# Patient Record
Sex: Female | Born: 1975 | Race: Asian | Hispanic: No | Marital: Single | Smoking: Current every day smoker
Health system: Southern US, Community
[De-identification: ages and names within clinical notes are randomized; demographics above are authoritative.]

## PROBLEM LIST (undated history)

## (undated) DIAGNOSIS — I10 Essential (primary) hypertension: Secondary | ICD-10-CM

## (undated) DIAGNOSIS — T7840XA Allergy, unspecified, initial encounter: Secondary | ICD-10-CM

## (undated) DIAGNOSIS — E785 Hyperlipidemia, unspecified: Secondary | ICD-10-CM

## (undated) DIAGNOSIS — F419 Anxiety disorder, unspecified: Secondary | ICD-10-CM

## (undated) DIAGNOSIS — G4730 Sleep apnea, unspecified: Secondary | ICD-10-CM

## (undated) DIAGNOSIS — Q248 Other specified congenital malformations of heart: Secondary | ICD-10-CM

## (undated) DIAGNOSIS — R011 Cardiac murmur, unspecified: Secondary | ICD-10-CM

## (undated) HISTORY — PX: TOTAL HIP ARTHROPLASTY: SHX124

## (undated) HISTORY — DX: Anxiety disorder, unspecified: F41.9

## (undated) HISTORY — DX: Hyperlipidemia, unspecified: E78.5

## (undated) HISTORY — DX: Other specified congenital malformations of heart: Q24.8

## (undated) HISTORY — DX: Sleep apnea, unspecified: G47.30

## (undated) HISTORY — DX: Allergy, unspecified, initial encounter: T78.40XA

## (undated) HISTORY — DX: Cardiac murmur, unspecified: R01.1

## (undated) HISTORY — PX: WISDOM TOOTH EXTRACTION: SHX21

---

## 2004-08-03 ENCOUNTER — Emergency Department (HOSPITAL_COMMUNITY): Admission: EM | Admit: 2004-08-03 | Discharge: 2004-08-03 | Payer: Self-pay | Admitting: Emergency Medicine

## 2004-09-08 ENCOUNTER — Ambulatory Visit: Payer: Self-pay | Admitting: Family Medicine

## 2005-05-26 ENCOUNTER — Ambulatory Visit: Payer: Self-pay | Admitting: Family Medicine

## 2005-07-12 ENCOUNTER — Other Ambulatory Visit: Admission: RE | Admit: 2005-07-12 | Discharge: 2005-07-12 | Payer: Self-pay | Admitting: Family Medicine

## 2005-07-12 ENCOUNTER — Ambulatory Visit: Payer: Self-pay | Admitting: Family Medicine

## 2005-07-12 ENCOUNTER — Encounter: Payer: Self-pay | Admitting: Family Medicine

## 2005-11-26 ENCOUNTER — Ambulatory Visit: Payer: Self-pay | Admitting: Family Medicine

## 2006-03-31 ENCOUNTER — Ambulatory Visit: Payer: Self-pay | Admitting: Family Medicine

## 2006-05-12 ENCOUNTER — Ambulatory Visit: Payer: Self-pay | Admitting: Family Medicine

## 2006-08-18 ENCOUNTER — Ambulatory Visit: Payer: Self-pay | Admitting: Family Medicine

## 2006-08-24 ENCOUNTER — Ambulatory Visit: Payer: Self-pay | Admitting: Licensed Clinical Social Worker

## 2006-08-25 ENCOUNTER — Ambulatory Visit: Payer: Self-pay | Admitting: Family Medicine

## 2008-11-19 ENCOUNTER — Other Ambulatory Visit: Admission: RE | Admit: 2008-11-19 | Discharge: 2008-11-19 | Payer: Self-pay | Admitting: Family Medicine

## 2008-11-19 ENCOUNTER — Encounter: Payer: Self-pay | Admitting: Family Medicine

## 2008-11-19 ENCOUNTER — Ambulatory Visit: Payer: Self-pay | Admitting: Family Medicine

## 2008-11-19 LAB — CONVERTED CEMR LAB: Beta hcg, urine, semiquantitative: NEGATIVE

## 2008-12-19 ENCOUNTER — Telehealth: Payer: Self-pay | Admitting: Family Medicine

## 2009-02-26 ENCOUNTER — Ambulatory Visit: Payer: Self-pay | Admitting: Family Medicine

## 2009-04-24 ENCOUNTER — Encounter: Payer: Self-pay | Admitting: Family Medicine

## 2009-05-13 ENCOUNTER — Other Ambulatory Visit: Admission: RE | Admit: 2009-05-13 | Discharge: 2009-05-13 | Payer: Self-pay | Admitting: Family Medicine

## 2009-05-13 ENCOUNTER — Encounter: Payer: Self-pay | Admitting: Family Medicine

## 2009-05-13 ENCOUNTER — Ambulatory Visit: Payer: Self-pay | Admitting: Family Medicine

## 2009-05-16 ENCOUNTER — Telehealth: Payer: Self-pay | Admitting: Family Medicine

## 2009-10-03 ENCOUNTER — Other Ambulatory Visit: Admission: RE | Admit: 2009-10-03 | Discharge: 2009-10-03 | Payer: Self-pay | Admitting: Family Medicine

## 2009-10-03 ENCOUNTER — Ambulatory Visit: Payer: Self-pay | Admitting: Family Medicine

## 2009-10-03 LAB — HM PAP SMEAR

## 2009-10-21 ENCOUNTER — Telehealth: Payer: Self-pay | Admitting: Family Medicine

## 2009-11-18 ENCOUNTER — Telehealth: Payer: Self-pay | Admitting: Family Medicine

## 2009-11-25 ENCOUNTER — Telehealth: Payer: Self-pay | Admitting: Family Medicine

## 2010-01-08 ENCOUNTER — Ambulatory Visit: Payer: Self-pay | Admitting: Family Medicine

## 2010-02-23 ENCOUNTER — Ambulatory Visit: Payer: Self-pay | Admitting: Family Medicine

## 2010-04-09 ENCOUNTER — Ambulatory Visit: Payer: Self-pay | Admitting: Family Medicine

## 2010-07-01 ENCOUNTER — Ambulatory Visit: Payer: Self-pay | Admitting: Family Medicine

## 2010-09-30 ENCOUNTER — Ambulatory Visit
Admission: RE | Admit: 2010-09-30 | Discharge: 2010-09-30 | Payer: Self-pay | Source: Home / Self Care | Attending: Family Medicine | Admitting: Family Medicine

## 2010-10-01 ENCOUNTER — Ambulatory Visit: Admit: 2010-10-01 | Payer: Self-pay | Admitting: Family Medicine

## 2010-10-20 NOTE — Assessment & Plan Note (Signed)
Summary: Depo Shot/cjr   Nurse Visit     Allergies: No Known Drug Allergies     Medication Administration  Injection # 1:    Medication: Depo-Provera 150mg     Diagnosis: UNSPECIFIED CONTRACEPTIVE MANAGEMENT (ICD-V25.9)    Route: IM    Site: L deltoid    Exp Date: 02/19/2011    Lot #: [id number]    Mfr: greenstone ltd    Comments: patient to return May 20, 2009    Patient tolerated injection without complications    Given by: Kern Reap CMA (February 26, 2009 10:23 AM)  Orders Added: 1)  Depo-Provera 150mg  [J1055] 2)  Admin of Therapeutic Inj  intramuscular or subcutaneous [04540]

## 2010-10-20 NOTE — Assessment & Plan Note (Signed)
Summary: depo provera inj//ccm   Nurse Visit   Allergies: No Known Drug Allergies  Medication Administration  Injection # 1:    Medication: Depo-Provera 150mg     Diagnosis: CONTRACEPTIVE MANAGEMENT (ICD-V25.09)    Route: IM    Site: RUOQ gluteus    Exp Date: 01/19/2012    Lot #: [id number]    Mfr: [email or web address]    Patient tolerated injection without complications    Given by: Kern Reap CMA Duncan Dull) (January 08, 2010 9:18 AM)  Orders Added: 1)  Urine Pregnancy Test  [81025] 2)  Depo-Provera 150mg  [J1055] 3)  Admin of Therapeutic Inj  intramuscular or subcutaneous [04540]

## 2010-10-20 NOTE — Progress Notes (Signed)
Summary: bcp  Call back at Work Phone [phone] Call back at [phone]   Caller: Patient Call For: Roderick Pee MD Summary of Call: pt is having break thru bleeding she would like a pack of bcp [facility] [phone]. she is due for depo inj on 4-8-201.1 Initial call taken by: Heron Sabins,  November 18, 2009 4:47 PM    New/Updated Medications: ZOVIA 1/35E (28) 1-35 MG-MCG TABS (ETHYNODIOL DIAC-ETH ESTRADIOL) UAD Prescriptions: ZOVIA 1/35E (28) 1-35 MG-MCG TABS (ETHYNODIOL DIAC-ETH ESTRADIOL) UAD  #1 x 1   Entered and Authorized by:   Roderick Pee MD   Signed by:   Roderick Pee MD on 11/18/2009   Method used:   [facility]  [place] [address]       [address]       [place], [place]  [address]       Ph: [phone] or [phone]       Fax: [phone]   RxID:   [id number]

## 2010-10-20 NOTE — Assessment & Plan Note (Signed)
Summary: DEPO INJ/NJR   Nurse Visit   Allergies: No Known Drug Allergies  Medication Administration  Injection # 1:    Medication: Depo-Provera 150mg     Diagnosis: CONTRACEPTIVE MANAGEMENT (ICD-V25.09)    Route: IM    Site: LUOQ gluteus    Lot #: [id number]    Mfr: [email or web address]    Patient tolerated injection without complications    Given by: Lynann Beaver CMA (July 01, 2010 9:18 AM)  Orders Added: 1)  Depo-Provera 150mg  [J1055] 2)  Admin of Therapeutic Inj  intramuscular or subcutaneous [21308]

## 2010-10-20 NOTE — Assessment & Plan Note (Signed)
Summary: depo inj/1.30p/njr   Nurse Visit   Allergies: No Known Drug Allergies  Orders Added: 1)  No Charge Patient Arrived (NCPA0) [NCPA0]

## 2010-10-20 NOTE — Progress Notes (Signed)
Summary: breakthru bleeding on Depo Provera    Caller: Patient Summary of Call: [phone] Pt took her first Depo Provera 11/19/2008, and was told by Dr. Tawanna Cooler if break thru bleeding becomes a problem, he can put her on low dose estrogen. She has had to do this in the past.  Is this an option?  [phone] Initial call taken by: Lynann Beaver CMA,  December 19, 2008 12:41 PM  Follow-up for Phone Call        have Dr todd address when he gets back after holiday Follow-up by: Madelin Headings MD,  December 19, 2008 11:34 PM    New/Updated Medications: ZOVIA 1/35E (28) 1-35 MG-MCG TABS (ETHYNODIOL DIAC-ETH ESTRADIOL) UAD ZOVIA 1/35E (28) 1-35 MG-MCG TABS (ETHYNODIOL DIAC-ETH ESTRADIOL) take one tab two times a day until the bleeding ends I called Julienne told her we would collar and BCPs to take one twice a day until the bleeding stopped  Prescriptions: ZOVIA 1/35E (28) 1-35 MG-MCG TABS (ETHYNODIOL DIAC-ETH ESTRADIOL) take one tab two times a day until the bleeding ends  #1 x 2   Entered by:   Kern Reap CMA   Authorized by:   Roderick Pee MD   Signed by:   Kern Reap CMA on 12/25/2008   Method used:   [facility]  [place] [address]       [address]       [place], [place]  [address]       Ph: [phone] or [phone]       Fax: [phone]   RxID:   [id number] ZOVIA 1/35E (28) 1-35 MG-MCG TABS (ETHYNODIOL DIAC-ETH ESTRADIOL) UAD  #1 x 2   Entered and Authorized by:   Roderick Pee MD   Signed by:   Roderick Pee MD on 12/25/2008   Method used:   Print then Give to Patient   RxID:   [id number]

## 2010-10-20 NOTE — Assessment & Plan Note (Signed)
Summary: CONGESTION // RS   Vital Signs:  Patient profile:   35 year old female Weight:      111 pounds Temp:     98.4 degrees F oral BP sitting:   120 / 90  (left arm) Cuff size:   regular  Vitals Entered By: Kathrynn Speed CMA (February 23, 2010 11:08 AM)  History of Present Illness: Kathleen Rubio is a 35 year old single female, who comes in today for evaluation of a cough x 4 weeks.  She has no fever, earache, sore throat, sputum production.  She has no history of asthma, however, she has had a history of allergic rhinitis.  Review of systems negative.  She is a nonsmoker  she's also complaining of some panic attacks with anxiety with work.  She would like a refill on the beta-blocker.  We gave her a couple years ago  Current Medications (verified): 1)  Prednisone 20 Mg Tabs (Prednisone) .... As Directed 2)  Epipen 0.3 Mg/0.26ml (1:1000) Devi (Epinephrine Hcl (Anaphylaxis)) .... As Directed 3)  Depo-Provera 150 Mg/ml Susp (Medroxyprogesterone Acetate)  Allergies (verified): No Known Drug Allergies  Past History:  Past medical, surgical, family and social histories (including risk factors) reviewed for relevance to current acute and chronic problems.  Past Medical History: Reviewed history from 11/19/2008 and no changes required. bicuspid aortic valve Allergic rhinitis cystic acne.  Anterior chest wall dysfunction uterine bleeding, treated with Depo  every 3 months  Past Surgical History: Reviewed history from 11/19/2008 and no changes required. Denies surgical history  Family History: Reviewed history from 11/19/2008 and no changes required. father diabetic2 mother developed breast cancer at age 79 with a recurrence at age 89.  Also maternal grandmother had breast cancer no brothers no sisters  Social History: Reviewed history from 11/19/2008 and no changes required. Occupation: business Counsellor for the medical justice, group Single dAlcohol use-no Drug use-no Regular  exercise-yes Current Smoker  Review of Systems      See HPI  Physical Exam  General:  Well-developed,well-nourished,in no acute distress; alert,appropriate and cooperative throughout examination Mouth:  Oral mucosa and oropharynx without lesions or exudates.  Teeth in good repair. Neck:  No deformities, masses, or tenderness noted. Chest Wall:  No deformities, masses, or tenderness noted. Lungs:  Normal respiratory effort, chest expands symmetrically. Lungs are clear to auscultation, no crackles or wheezes. Heart:  Normal rate and regular rhythm. S1 and S2 normal without gallop, murmur, click, rub or other extra sounds.   Problems:  Medical Problems Added: 1)  Dx of Cough  (ICD-786.2)  Impression & Recommendations:  Problem # 1:  COUGH (ICD-786.2) Assessment New  Orders: Prescription Created Electronically [phone])  Complete Medication List: 1)  Prednisone 20 Mg Tabs (Prednisone) .... As directed 2)  Epipen 0.3 Mg/0.66ml (1:1000) Devi (Epinephrine hcl (anaphylaxis)) .... As directed 3)  Depo-provera 150 Mg/ml Susp (Medroxyprogesterone acetate) 4)  Corgard 20 Mg Tabs (Nadolol) .... Uad  Patient Instructions: 1)  begin prednisone, take two tabs x 3 days, one x 3 days, a half x 3 days, then half a tablet Monday, Wednesday, Friday, for a two week taper 2)  Please schedule a follow-up appointment as needed. Prescriptions: CORGARD 20 MG TABS (NADOLOL) UAD  #30 x 1   Entered and Authorized by:   Roderick Pee MD   Signed by:   Roderick Pee MD on 02/23/2010   Method used:   [facility]        [facility]  [place] [address]       [address]       [place], [place]  [address]       Ph: [phone] or [phone]       Fax: [phone]   RxID:   [id number] PREDNISONE 20 MG TABS (PREDNISONE) as directed  #30 x 1   Entered and Authorized by:   Roderick Pee MD   Signed by:   Roderick Pee MD on 02/23/2010   Method used:   [facility]        [facility]  [place] [address]       [address]       [place], [place]  [address]       Ph: [phone] or [phone]       Fax: [phone]   RxID:   [id number]

## 2010-10-20 NOTE — Progress Notes (Signed)
  Phone Note Outgoing Call   Summary of Call: I called [facility] and explained the low grade HPV.  Advised follow-up Pap.  This should clear up, but it may take time Initial call taken by: Roderick Pee MD,  May 16, 2009 5:35 PM

## 2010-10-20 NOTE — Assessment & Plan Note (Signed)
Summary: DEPO INJ // RS   Nurse Visit   Allergies: No Known Drug Allergies  Medication Administration  Injection # 1:    Medication: Depo-Provera 150mg     Diagnosis: CONTRACEPTIVE MANAGEMENT (ICD-V25.09)    Route: IM    Site: RUOQ gluteus    Exp Date: 07/21/2012    Lot #: [id number]    Mfr: [email or web address]    Comments: next time 07/01/2010    Patient tolerated injection without complications    Given by: Kern Reap CMA Duncan Dull) (April 09, 2010 5:45 PM)  Orders Added: 1)  Admin of Therapeutic Inj  intramuscular or subcutaneous [96372] 2)  Depo-Provera 150mg  [J1055]

## 2010-10-20 NOTE — Assessment & Plan Note (Signed)
Summary: PAP SMEAR // RS   Vital Signs:  Patient profile:   35 year old female Weight:      117 pounds Temp:     98.6 degrees F oral BP sitting:   126 / 72  Vitals Entered By: Lynann Beaver CMA (October 03, 2009 2:50 PM) CC: repeat pap Is Patient Diabetic? No Pain Assessment Patient in pain? no        CC:  repeat pap.  History of Present Illness: Kathleen Rubio is a 35 year old single female, nonsmoker, who comes in today for follow-up Pap.  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  Follow-up in August same asymptomatic.  She continues to take azithromycin one daily for cystic, acne it's working well.  She wishes to continue  Leonette Most is urticaria of unknown etiology, for which she takes an occasional prednisone.  Usually, she states if she catches it right away to take for 20-mg tablets stat and the urticaria goes away.  She is 40 tablets in the last 10 months.  Current Medications (verified): 1)  Prednisone 20 Mg Tabs (Prednisone) .... As Directed 2)  Epipen 0.3 Mg/0.79ml (1:1000) Devi (Epinephrine Hcl (Anaphylaxis)) .... As Directed 3)  Depo-Provera 150 Mg/ml Susp (Medroxyprogesterone Acetate) 4)  Azithromycin 250 Mg Tabs (Azithromycin) .Marland Kitchen.. 1 Tab @ Bedtime  Allergies (verified): No Known Drug Allergies  Past History:  Past medical, surgical, family and social histories (including risk factors) reviewed, and no changes noted (except as noted below). Past medical, surgical, family and social histories (including risk factors) reviewed for relevance to current acute and chronic problems.  Past Medical History: Reviewed history from 11/19/2008 and no changes required. bicuspid aortic valve Allergic rhinitis cystic acne.  Anterior chest wall dysfunction uterine bleeding, treated with Depo  every 3 months  Past Surgical History: Reviewed history from 11/19/2008 and no changes required. Denies surgical history  Family  History: Reviewed history from 11/19/2008 and no changes required. father diabetic2 mother developed breast cancer at age 64 with a recurrence at age 21.  Also maternal grandmother had breast cancer no brothers no sisters  Social History: Reviewed history from 11/19/2008 and no changes required. 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[employer], [employer] Single dAlcohol use-no Drug use-no Regular exercise-yes Current Smoker  Review of Systems      See HPI  Physical Exam  General:  Well-developed,well-nourished,in no acute distress; alert,appropriate and cooperative throughout examination Genitalia:  Pelvic Exam:        External: normal female genitalia without lesions or masses        Vagina: normal without lesions or masses        Cervix: normal without lesions or masses        Adnexa: normal bimanual exam without masses or fullness        Uterus: normal by palpation        Pap smear: performed   Impression & Recommendations:  Problem # 1:  ABNORMAL GLANDULAR PAPANICOLAOU SMEAR OF VAGINA (ICD-795.10) Assessment Unchanged  Complete Medication List: 1)  Prednisone 20 Mg Tabs (Prednisone) .... As directed 2)  Epipen 0.3 Mg/0.73ml (1:1000) Devi (Epinephrine hcl (anaphylaxis)) .... As directed 3)  Depo-provera 150 Mg/ml Susp (Medroxyprogesterone acetate) 4)  Azithromycin 250 Mg Tabs (Azithromycin) .Marland Kitchen.. 1 tab @ bedtime  Other Orders: Admin of Therapeutic Inj  intramuscular or subcutaneous ([address]) Depo-Provera 150mg  (U0454)  Patient Instructions: 1)  I will call you next week with you report  Prescriptions: AZITHROMYCIN 250 MG TABS (AZITHROMYCIN) 1 tab @ bedtime  #30 x 5   Entered and Authorized by:   Roderick Pee MD   Signed by:   Roderick Pee MD on 10/03/2009   Method used:   [facility]        [facility]  [place] [address] [facility]       [address]       [place], [place]  [address]       Ph: [phone] or [phone]       Fax: [phone]   RxID:    [id number] PREDNISONE 20 MG TABS (PREDNISONE) as directed  #50 x 1   Entered and Authorized by:   Roderick Pee MD   Signed by:   Roderick Pee MD on 10/03/2009   Method used:   [facility]        [facility]  [place] [address]       [address]       [place], [place]  [address]       Ph: [phone] or [phone]       Fax: [phone]   RxID:   [id number]    Medication Administration  Injection # 1:    Medication: Depo-Provera 150mg     Diagnosis: CONTRACEPTIVE MANAGEMENT (ICD-V25.09)    Route: IM    Site: LUOQ gluteus    Exp Date: 12/21/2011    Lot #: [id number]    Mfr: [email or web address]    Patient tolerated injection without complications    Given by: Lynann Beaver CMA (October 03, 2009 3:14 PM)  Orders Added: 1)  Est. Patient Level III [75102] 2)  Admin of Therapeutic Inj  intramuscular or subcutaneous [96372] 3)  Depo-Provera 150mg  [J1055]

## 2010-10-20 NOTE — Assessment & Plan Note (Signed)
Summary: pap only///ccm   Vital Signs:  Patient profile:   35 year old female Weight:      117 pounds Temp:     98.4 degrees F oral BP sitting:   124 / 82  (left arm) Cuff size:   regular  Vitals Entered By: Army Fossa CMA (May 13, 2009 10:15 AM) CC: Pap only    CC:  Pap only .  History of Present Illness: Kathleen Rubio is a 35 year old female, who comes back today for evaluation of two problems.  She is on the Depo-Provera and doing well.  No side effects.  She had some atypical cells and is here for repeat Pap.  She also has cystic acne.  Has been on many medications throughout the years, nothing truly helped.  She wants to know about Accutane.  She also has a history of a bicuspid aortic valve.  We will check her heart.  No history of murmurs  Allergies (verified): No Known Drug Allergies  Past History:  Past medical, surgical, family and social histories (including risk factors) reviewed, and no changes noted (except as noted below).  Past Medical History: Reviewed history from 11/19/2008 and no changes required. bicuspid aortic valve Allergic rhinitis cystic acne.  Anterior chest wall dysfunction uterine bleeding, treated with Depo  every 3 months  Past Surgical History: Reviewed history from 11/19/2008 and no changes required. Denies surgical history  Family History: Reviewed history from 11/19/2008 and no changes required. father diabetic2 mother developed breast cancer at age 47 with a recurrence at age 34.  Also maternal grandmother had breast cancer no brothers no sisters  Social History: Reviewed history from 11/19/2008 and no changes required. Occupation: business Counsellor for the medical justice, group Single dAlcohol use-no Drug use-no Regular exercise-yes Current Smoker  Review of Systems      See HPI  Physical Exam  General:  Well-developed,well-nourished,in no acute distress; alert,appropriate and cooperative throughout  examination Heart:  Normal rate and regular rhythm. S1 and S2 normal without gallop, murmur, click, rub or other extra sounds. Genitalia:  Pelvic Exam:        External: normal female genitalia without lesions or masses        Vagina: normal without lesions or masses        Cervix: normal without lesions or masses        Adnexa: normal bimanual exam without masses or fullness        Uterus: normal by palpation        Pap smear: performed Skin:  eczema type rash in her axillary areas.  Also cystic lesions.  Anterior chest wall   Impression & Recommendations:  Problem # 1:  ACNE, CYSTIC (ICD-706.1) Assessment Unchanged  Orders: Prescription Created Electronically [phone])  Her updated medication list for this problem includes:    Azithromycin 250 Mg Tabs (Azithromycin) .Marland Kitchen... 1 tab @ bedtime  Problem # 2:  BICUSPID AORTIC VALVE (ICD-746.4) Assessment: Unchanged  Orders: Prescription Created Electronically [phone])  Problem # 3:  ABNORMAL GLANDULAR PAPANICOLAOU SMEAR OF VAGINA (ICD-795.10) Assessment: New  Orders: Prescription Created Electronically [phone])  Problem # 4:  DYSHIDROSIS (ICD-705.81) Assessment: New  Orders: Prescription Created Electronically [phone])  Complete Medication List: 1)  Prednisone 20 Mg Tabs (Prednisone) .... As directed 2)  Epipen 0.3 Mg/0.76ml (1:1000) Devi (Epinephrine hcl (anaphylaxis)) .... As directed 3)  Depo-provera 150 Mg/ml Susp (Medroxyprogesterone acetate) 4)  Azithromycin 250 Mg Tabs (Azithromycin) .Marland Kitchen.. 1 tab @ bedtime  Other Orders: Depo-Provera 150mg  (J1055) Admin of  Therapeutic Inj  intramuscular or subcutaneous (16109)  Patient Instructions: 1)  let's try azithromycin, one tablet daily if after two to 3 weeks.  She don't see any improvement.  Consult with [facility] dermatology. 2)  I will call you I did tear a lab work back. 3)  Apply over-the-counter Cortaid cream to 3 times a day to the rash in the axillary area.  If you don't  see any improvement in a couple weeks.  Let us know.  We will call in a more potent cream Prescriptions: AZITHROMYCIN 250 MG TABS (AZITHROMYCIN) 1 tab @ bedtime  #30 x 5   Entered and Authorized by:   Roderick Pee MD   Signed by:   Roderick Pee MD on 05/13/2009   Method used:   [facility]        [facility]  [place] [address]       [address]       [place], [place]  [address]       Ph: [phone] or [phone]       Fax: [phone]   RxID:   [id number]    Medication Administration  Injection # 1:    Medication: Depo-Provera 150mg     Diagnosis: CONTRACEPTIVE MANAGEMENT (ICD-V25.09)    Route: IM    Site: RUOQ gluteus    Exp Date: 09/2011    Lot #: [id number]    Mfr: [email or web address]    Patient tolerated injection without complications    Given by: Army Fossa CMA (May 13, 2009 10:52 AM)  Orders Added: 1)  Prescription Created Electronically [G8553] 2)  Est. Patient Level IV [47425] 3)  Depo-Provera 150mg  [J1055] 4)  Admin of Therapeutic Inj  intramuscular or subcutaneous [95638]

## 2010-10-20 NOTE — Assessment & Plan Note (Signed)
Summary: follow up/mhf rsc with patient/mhf   Vital Signs:  Patient Profile:   35 Years Old Female Weight:      113 pounds Temp:     98.2 degrees F oral BP sitting:   124 / 84  (left arm) Cuff size:   regular  Vitals Entered By: Kern Reap CMA (November 19, 2008 9:11 AM)                 Chief Complaint:  depo shot.  History of Present Illness: Karielle is a 35 year old female, who comes in today for physical evaluation.  She has a history of an underlying bicuspid aortic valve.  We do yearly evaluation plus an annual echocardiogram.  She continues to be asymptomatic.  She also has a history of underlying allergic rhinitis.  Also  has a history of dysfunction uterine bleeding.  However, she is taking depo  shots every 3 months and doing well.  She also smokes a third of a pack a cigarettes a day and would like to start a smoking cessation program.  Her mother was diagnosed to have breast cancer at age 60.  She needs to start having annual mammography at age 62.  Will also encourage meticulous breast self-examination.  At home.also her maternal grandmother had breast cancer.  Offered a consult with the breast cancer research project at [facility] will consider it  Last tetanus booster 2004.  She history of cystic acne with multiple cysts and into chest wall.  One extends into the left breast.  She also has a cystic lesion in her right groin.  It's in her underwear line and is constantly irritated.    Prior Medication List:  No prior medications documented  Current Allergies: No known allergies   Past Medical History:    Reviewed history and no changes required:       bicuspid aortic valve       Allergic rhinitis       cystic acne.  Anterior chest wall       dysfunction uterine bleeding, treated with Depo  every 3 months  Past Surgical History:    Reviewed history and no changes required:       Denies surgical history   Family History:    Reviewed history and  no changes required:       father diabetic2       mother developed breast cancer at age 57 with a recurrence at age 30.  Also maternal grandmother had breast cancer       no brothers no sisters  Social History:    Reviewed history and no changes required:       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[employer]       Single       dAlcohol use-no       Drug use-no       Regular exercise-yes       Current Smoker   Risk Factors:  Tobacco use:  current    Counseled to quit/cut down tobacco use:  yes Drug use:  no Alcohol use:  no Exercise:  yes   Review of Systems      See HPI   Physical Exam  General:     Well-developed,well-nourished,in no acute distress; alert,appropriate and cooperative throughout examination Head:     Normocephalic and atraumatic without obvious abnormalities. No apparent alopecia or balding. Eyes:     No corneal or conjunctival inflammation noted. EOMI. Perrla. Funduscopic exam  benign, without hemorrhages, exudates or papilledema. Vision grossly normal. Ears:     External ear exam shows no significant lesions or deformities.  Otoscopic examination reveals clear canals, tympanic membranes are intact bilaterally without bulging, retraction, inflammation or discharge. Hearing is grossly normal bilaterally. Nose:     External nasal examination shows no deformity or inflammation. Nasal mucosa are pink and moist without lesions or exudates. Mouth:     Oral mucosa and oropharynx without lesions or exudates.  Teeth in good repair. Neck:     No deformities, masses, or tenderness noted. Chest Wall:     multiple scars from old cystic acting one, which is marble size and extends up into the left breast Breasts:     No mass, nodules, thickening, tenderness, bulging, retraction, inflamation, nipple discharge or skin changes noted.   Lungs:     Normal respiratory effort, chest expands symmetrically. Lungs are clear to auscultation, no crackles or  wheezes. Heart:     the PMI is in the fifth intercostal space, midclavicular line.  No heaves, or thrills.  There is a click in the aortic area, but no murmur. Abdomen:     Bowel sounds positive,abdomen soft and non-tender without masses, organomegaly or hernias noted. Genitalia:     Pelvic Exam:        External: normal female genitalia without lesions or masses        Vagina: normal without lesions or masses        Cervix: normal without lesions or masses        Adnexa: normal bimanual exam without masses or fullness        Uterus: normal by palpation        Pap smear: performed Msk:     No deformity or scoliosis noted of thoracic or lumbar spine.   Pulses:     R and L carotid,radial,femoral,dorsalis pedis and posterior tibial pulses are full and equal bilaterally Extremities:     No clubbing, cyanosis, edema, or deformity noted with normal full range of motion of all joints.   Neurologic:     No cranial nerve deficits noted. Station and gait are normal. Plantar reflexes are down-going bilaterally. DTRs are symmetrical throughout. Sensory, motor and coordinative functions appear intact. Skin:     Intact without suspicious lesions or rashes Cervical Nodes:     No lymphadenopathy noted Axillary Nodes:     No palpable lymphadenopathy Inguinal Nodes:     No significant adenopathy Psych:     Cognition and judgment appear intact. Alert and cooperative with normal attention span and concentration. No apparent delusions, illusions, hallucinations    Impression & Recommendations:  Problem # 1:  UNSPECIFIED CONTRACEPTIVE MANAGEMENT (ICD-V25.9) Assessment: Improved  Orders: Urine Pregnancy Test  (16109) Depo-Provera 150mg  (J1055) Admin of Therapeutic Inj  intramuscular or subcutaneous (60454)   Problem # 2:  ALLERGIC RHINITIS (ICD-477.9) Assessment: Improved  Problem # 3:  BICUSPID AORTIC VALVE (ICD-746.4) Assessment: Unchanged  Problem # 4:  ACNE, CYSTIC  (ICD-706.1) Assessment: Unchanged  Complete Medication List: 1)  Chantix Starting Month Pak 0.5 Mg X 11 & 1 Mg X 42 Tabs (Varenicline tartrate) .... Uad   Patient Instructions: 1)  Take an Aspirin every day. 2)  Please schedule a follow-up appointment as needed. 3)  considered the breast cancer research project.  [facility] mother and grandmother both had breast cancer   Prescriptions: CHANTIX STARTING MONTH PAK 0.5 MG X 11 & 1 MG X 42 TABS (VARENICLINE TARTRATE) UAD  #  1 x 1   Entered and Authorized by:   Roderick Pee MD   Signed by:   Roderick Pee MD on 11/19/2008   Method used:   Print then Give to Patient   RxID:   [id number]   Laboratory Results   Urine Tests      Urine HCG: negative      Medication Administration  Injection # 1:    Medication: Depo-Provera 150mg     Diagnosis: UNSPECIFIED CONTRACEPTIVE MANAGEMENT (ICD-V25.9)    Route: IM    Site: RUOQ gluteus    Exp Date: 10/21/2010    Lot #: [id number]    Mfr: [email or web address]    Comments: pt to return may24, 2010    Patient tolerated injection without complications    Given by: Kern Reap CMA (November 19, 2008 11:07 AM)  Orders Added: 1)  Urine Pregnancy Test  [81025] 2)  Est. Patient 18-39 years [99395] 3)  Depo-Provera 150mg  [J1055] 4)  Admin of Therapeutic Inj  intramuscular or subcutaneous [96372] 5)  Est. Patient 18-39 years [47425]

## 2010-10-20 NOTE — Progress Notes (Signed)
  Phone Note Outgoing Call   Summary of Call: I called the Kathleen Rubio, her Pap smear has returned to normal.  Recommend every 6 months.  Follow-up x 2 years.  She will come in July for complete evaluation including repeat Pap Initial call taken by: Roderick Pee MD,  October 21, 2009 3:52 PM

## 2010-10-20 NOTE — Progress Notes (Signed)
Summary: generic ambien  Phone Note Call from Patient Call back at Home Phone [phone]   Caller: Patient Call For: Roderick Pee MD Summary of Call: pt will be travelling overseas she is requesting generic ambien #12 [facility] [phone]. Initial call taken by: Heron Sabins,  November 25, 2009 4:08 PM  Follow-up for Phone Call        ambien 10 mg, dispensed 10 tablets directions one half tab nightly p.r.n. sleep, refills x 1 Follow-up by: Roderick Pee MD,  November 25, 2009 4:27 PM    New/Updated Medications: ZOLPIDEM TARTRATE 10 MG TABS (ZOLPIDEM TARTRATE) take half tab at bedtime as needed Prescriptions: ZOLPIDEM TARTRATE 10 MG TABS (ZOLPIDEM TARTRATE) take half tab at bedtime as needed  #10 x 1   Entered by:   Kern Reap CMA (AAMA)   Authorized by:   Roderick Pee MD   Signed by:   Kern Reap CMA (AAMA) on 11/25/2009   Method used:   Telephoned to ...       [facility]  [place] [address] (retail)       [address]       [place], [place]  [address]       Ph: [phone] or [phone]       Fax: [phone]   RxID:   [id number]

## 2010-10-22 NOTE — Assessment & Plan Note (Signed)
Summary: DEPO INJ//CCM   Nurse Visit   Allergies: No Known Drug Allergies  Medication Administration  Injection # 1:    Medication: Depo-Provera 150mg     Diagnosis: CONTRACEPTIVE MANAGEMENT (ICD-V25.09)    Route: IM    Site: LUOQ gluteus    Exp Date: 970    Lot #: [id number]    Mfr: [email or web address]    Patient tolerated injection without complications    Given by: Kern Reap CMA (AAMA) (September 30, 2010 11:28 AM)  Orders Added: 1)  Depo-Provera 150mg  [J1055] 2)  Admin of Therapeutic Inj  intramuscular or subcutaneous [04540]

## 2010-10-23 ENCOUNTER — Other Ambulatory Visit: Payer: Self-pay | Admitting: Family Medicine

## 2010-10-26 NOTE — Telephone Encounter (Signed)
Fleet Contras please call and find out why she wants a refill on azithromycin.  It may be for acne

## 2010-10-26 NOTE — Telephone Encounter (Signed)
Left message on machine for patient to call back with reason for refill of rx.

## 2010-10-26 NOTE — Telephone Encounter (Signed)
Kathleen O. K. To renew medication until next physical exam  

## 2010-10-29 ENCOUNTER — Other Ambulatory Visit: Payer: Self-pay | Admitting: Family Medicine

## 2010-11-09 ENCOUNTER — Encounter: Payer: Self-pay | Admitting: Family Medicine

## 2010-11-10 ENCOUNTER — Encounter: Payer: Self-pay | Admitting: Family Medicine

## 2010-11-10 DIAGNOSIS — Z0289 Encounter for other administrative examinations: Secondary | ICD-10-CM

## 2010-12-01 ENCOUNTER — Other Ambulatory Visit: Payer: Self-pay

## 2010-12-08 ENCOUNTER — Encounter: Payer: Self-pay | Admitting: Family Medicine

## 2010-12-08 DIAGNOSIS — Z0289 Encounter for other administrative examinations: Secondary | ICD-10-CM

## 2010-12-16 ENCOUNTER — Encounter: Payer: Self-pay | Admitting: Family Medicine

## 2010-12-16 ENCOUNTER — Ambulatory Visit: Payer: Self-pay | Admitting: Family Medicine

## 2010-12-17 ENCOUNTER — Ambulatory Visit (INDEPENDENT_AMBULATORY_CARE_PROVIDER_SITE_OTHER): Payer: Medicare HMO | Admitting: Family Medicine

## 2010-12-17 ENCOUNTER — Other Ambulatory Visit (HOSPITAL_COMMUNITY)
Admission: RE | Admit: 2010-12-17 | Discharge: 2010-12-17 | Disposition: A | Discharge status: Home / Self Care | Payer: Medicare HMO | Source: Ambulatory Visit | Attending: Family Medicine | Admitting: Family Medicine

## 2010-12-17 ENCOUNTER — Encounter: Payer: Self-pay | Admitting: Family Medicine

## 2010-12-17 DIAGNOSIS — F17200 Nicotine dependence, unspecified, uncomplicated: Secondary | ICD-10-CM

## 2010-12-17 DIAGNOSIS — Z124 Encounter for screening for malignant neoplasm of cervix: Secondary | ICD-10-CM | POA: Insufficient documentation

## 2010-12-17 DIAGNOSIS — Z23 Encounter for immunization: Secondary | ICD-10-CM

## 2010-12-17 DIAGNOSIS — F17210 Nicotine dependence, cigarettes, uncomplicated: Secondary | ICD-10-CM

## 2010-12-17 DIAGNOSIS — J309 Allergic rhinitis, unspecified: Secondary | ICD-10-CM

## 2010-12-17 DIAGNOSIS — L708 Other acne: Secondary | ICD-10-CM

## 2010-12-17 DIAGNOSIS — Z0000 Encounter for general adult medical examination without abnormal findings: Secondary | ICD-10-CM

## 2010-12-17 DIAGNOSIS — Z309 Encounter for contraceptive management, unspecified: Secondary | ICD-10-CM

## 2010-12-17 DIAGNOSIS — Q231 Congenital insufficiency of aortic valve: Secondary | ICD-10-CM

## 2010-12-17 MED ORDER — VARENICLINE TARTRATE 1 MG PO TABS: ORAL_TABLET | ORAL | Status: DC

## 2010-12-17 MED ORDER — EPINEPHRINE 0.3 MG/0.3ML IJ DEVI: 0.3000 mg | Freq: Once | INTRAMUSCULAR | Status: AC

## 2010-12-17 MED ORDER — MEDROXYPROGESTERONE ACETATE 150 MG/ML IM SUSP
150.0000 mg | Freq: Once | INTRAMUSCULAR | Status: AC
  Administered 2010-12-17: 150 mg via INTRAMUSCULAR

## 2010-12-17 MED ORDER — PREDNISONE 20 MG PO TABS: 20.0000 mg | ORAL_TABLET | ORAL | Status: DC

## 2010-12-17 NOTE — Patient Instructions (Signed)
Called the breast Center and get set up for a screening mammogram.  Begin the chantix by taking a half a tablet daily.  Follow-up in 6 weeks with me.  At that point scheduled 30 minute appointment, so we can also remove some of the lesions that we discussed

## 2010-12-17 NOTE — Progress Notes (Signed)
  Subjective:    Patient ID: Kathleen Rubio, female    DOB: 06/05/1976, 35 y.o.   MRN: [id number]  HPIjoy Is a 35 year old single female, smoker.,,,,,,,, one to two cigarettes per day,,,,,,,, who comes in today for general physical examination because of a history of allergic rhinitis.  Cystic acne involving her chest wall.  Dysfunctional uterine bleeding.  She would like to quit smoking.  Will put her on the chantix program.  Her mother was recently diagnosed as squamous cell carcinoma of the neck.  She had breast cancer with recurrence many years ago.  Also, her maternal grandmother also had breast cancer.  Advise she began screaming at age 35.  She is also concerned about her skin.  She has some lesions on her chest that are worrisome to her    Review of Systems  Constitutional: Negative.   HENT: Negative.   Eyes: Negative.   Respiratory: Negative.   Cardiovascular: Negative.   Gastrointestinal: Negative.   Genitourinary: Negative.   Musculoskeletal: Negative.   Neurological: Negative.   Hematological: Negative.   Psychiatric/Behavioral: Negative.        Objective:   Physical Exam  Constitutional: She appears well-developed and well-nourished.  HENT:  Head: Normocephalic and atraumatic.  Right Ear: External ear normal.  Left Ear: External ear normal.  Nose: Nose normal.  Mouth/Throat: Oropharynx is clear and moist.  Eyes: EOM are normal. Pupils are equal, round, and reactive to light.  Neck: Normal range of motion. Neck supple. No thyromegaly present.  Cardiovascular: Normal rate, regular rhythm, normal heart sounds and intact distal pulses.  Exam reveals no gallop and no friction rub.   No murmur heard. Pulmonary/Chest: Effort normal and breath sounds normal.  Abdominal: Soft. Bowel sounds are normal. She exhibits no distension and no mass. There is no tenderness. There is no rebound.  Genitourinary: Vagina normal and uterus normal. Guaiac negative stool. No vaginal discharge  found.       Bilateral breast exam shows a cystic lesion in the right breast at the 12 o'clock position just adjacent to the nipple.  Its size of a marble is soft is rubbery and movable, area.  It's been present previously  Musculoskeletal: Normal range of motion.  Lymphadenopathy:    She has no cervical adenopathy.  Neurological: She is alert. She has normal reflexes. No cranial nerve deficit. She exhibits normal muscle tone. Coordination normal.  Skin: Skin is warm and dry.  Psychiatric: She has a normal mood and affect. Her behavior is normal. Judgment and thought content normal.          Assessment & Plan:  Healthy female.  Cystic lesion, right breast.  Recommend screening mammogram.  History of allergic rhinitis, prednisone p.r.n.  Activity for lesions.  Anterior chest wall returned for possible removal.

## 2011-01-06 ENCOUNTER — Other Ambulatory Visit: Payer: Self-pay | Admitting: Family Medicine

## 2011-01-06 DIAGNOSIS — Z1231 Encounter for screening mammogram for malignant neoplasm of breast: Secondary | ICD-10-CM

## 2011-01-06 DIAGNOSIS — Z803 Family history of malignant neoplasm of breast: Secondary | ICD-10-CM

## 2011-01-12 ENCOUNTER — Telehealth: Payer: Self-pay | Admitting: *Deleted

## 2011-01-12 MED ORDER — ZOLPIDEM TARTRATE 10 MG PO TABS: ORAL_TABLET | ORAL | Status: DC

## 2011-01-12 NOTE — Telephone Encounter (Signed)
Ambien 5 mg, dispense 10 tabs directions one half tab nightly p.r.n. Refill x 1,,,,,,,, cautioned to not take on an air plane because of incidence of blood clots

## 2011-01-12 NOTE — Telephone Encounter (Signed)
Pt is requesting [facility] for [place] travel.

## 2011-01-28 ENCOUNTER — Ambulatory Visit: Payer: Medicare HMO | Admitting: Family Medicine

## 2011-02-02 ENCOUNTER — Ambulatory Visit
Admission: RE | Admit: 2011-02-02 | Discharge: 2011-02-02 | Disposition: A | Discharge status: Home / Self Care | Payer: Medicare HMO | Source: Ambulatory Visit | Attending: Family Medicine | Admitting: Family Medicine

## 2011-02-02 DIAGNOSIS — Z803 Family history of malignant neoplasm of breast: Secondary | ICD-10-CM

## 2011-02-02 DIAGNOSIS — Z1231 Encounter for screening mammogram for malignant neoplasm of breast: Secondary | ICD-10-CM

## 2011-03-18 ENCOUNTER — Ambulatory Visit (INDEPENDENT_AMBULATORY_CARE_PROVIDER_SITE_OTHER): Payer: Medicare HMO | Admitting: Family Medicine

## 2011-03-18 DIAGNOSIS — Z309 Encounter for contraceptive management, unspecified: Secondary | ICD-10-CM

## 2011-03-18 MED ORDER — MEDROXYPROGESTERONE ACETATE 150 MG/ML IM SUSP
150.0000 mg | Freq: Once | INTRAMUSCULAR | Status: AC
  Administered 2011-03-18: 150 mg via INTRAMUSCULAR

## 2011-05-31 ENCOUNTER — Ambulatory Visit (INDEPENDENT_AMBULATORY_CARE_PROVIDER_SITE_OTHER): Payer: Managed Care, Other (non HMO) | Admitting: Family Medicine

## 2011-05-31 DIAGNOSIS — Z309 Encounter for contraceptive management, unspecified: Secondary | ICD-10-CM

## 2011-05-31 MED ORDER — MEDROXYPROGESTERONE ACETATE 150 MG/ML IM SUSP
150.0000 mg | Freq: Once | INTRAMUSCULAR | Status: AC
  Administered 2011-05-31: 150 mg via INTRAMUSCULAR

## 2011-06-09 ENCOUNTER — Ambulatory Visit: Payer: Medicare HMO | Admitting: Family Medicine

## 2011-08-16 ENCOUNTER — Ambulatory Visit (INDEPENDENT_AMBULATORY_CARE_PROVIDER_SITE_OTHER): Payer: Managed Care, Other (non HMO) | Admitting: *Deleted

## 2011-08-16 DIAGNOSIS — Z23 Encounter for immunization: Secondary | ICD-10-CM

## 2011-08-16 DIAGNOSIS — Z309 Encounter for contraceptive management, unspecified: Secondary | ICD-10-CM

## 2011-08-16 MED ORDER — MEDROXYPROGESTERONE ACETATE 150 MG/ML IM SUSP
150.0000 mg | Freq: Once | INTRAMUSCULAR | Status: AC
  Administered 2011-08-16: 150 mg via INTRAMUSCULAR

## 2011-09-15 ENCOUNTER — Ambulatory Visit (INDEPENDENT_AMBULATORY_CARE_PROVIDER_SITE_OTHER): Payer: Managed Care, Other (non HMO) | Admitting: Family Medicine

## 2011-09-15 DIAGNOSIS — Z0000 Encounter for general adult medical examination without abnormal findings: Secondary | ICD-10-CM

## 2011-09-15 DIAGNOSIS — Z23 Encounter for immunization: Secondary | ICD-10-CM

## 2011-11-05 ENCOUNTER — Ambulatory Visit (INDEPENDENT_AMBULATORY_CARE_PROVIDER_SITE_OTHER): Payer: Managed Care, Other (non HMO) | Admitting: *Deleted

## 2011-11-05 DIAGNOSIS — Z309 Encounter for contraceptive management, unspecified: Secondary | ICD-10-CM

## 2011-11-05 MED ORDER — MEDROXYPROGESTERONE ACETATE 150 MG/ML IM SUSP
150.0000 mg | Freq: Once | INTRAMUSCULAR | Status: AC
  Administered 2011-11-05: 150 mg via INTRAMUSCULAR

## 2012-02-11 ENCOUNTER — Ambulatory Visit (INDEPENDENT_AMBULATORY_CARE_PROVIDER_SITE_OTHER): Payer: Managed Care, Other (non HMO) | Admitting: *Deleted

## 2012-02-11 DIAGNOSIS — Z309 Encounter for contraceptive management, unspecified: Secondary | ICD-10-CM

## 2012-02-11 DIAGNOSIS — J309 Allergic rhinitis, unspecified: Secondary | ICD-10-CM

## 2012-02-11 MED ORDER — MEDROXYPROGESTERONE ACETATE 150 MG/ML IM SUSP
150.0000 mg | Freq: Once | INTRAMUSCULAR | Status: AC
  Administered 2012-02-11: 150 mg via INTRAMUSCULAR

## 2012-02-11 MED ORDER — PREDNISONE 20 MG PO TABS: 20.0000 mg | ORAL_TABLET | ORAL | Status: DC

## 2012-02-11 MED ORDER — ZOLPIDEM TARTRATE 10 MG PO TABS: ORAL_TABLET | ORAL | Status: DC

## 2012-03-10 ENCOUNTER — Ambulatory Visit (INDEPENDENT_AMBULATORY_CARE_PROVIDER_SITE_OTHER): Payer: Managed Care, Other (non HMO) | Admitting: *Deleted

## 2012-03-10 DIAGNOSIS — Z0000 Encounter for general adult medical examination without abnormal findings: Secondary | ICD-10-CM

## 2012-03-10 DIAGNOSIS — Z23 Encounter for immunization: Secondary | ICD-10-CM

## 2012-03-31 ENCOUNTER — Other Ambulatory Visit: Payer: Self-pay | Admitting: Family Medicine

## 2012-03-31 ENCOUNTER — Telehealth: Payer: Self-pay | Admitting: Family Medicine

## 2012-03-31 DIAGNOSIS — Z1231 Encounter for screening mammogram for malignant neoplasm of breast: Secondary | ICD-10-CM

## 2012-03-31 NOTE — Telephone Encounter (Signed)
Patient called stating that she would like to have her cpx/pap and would like to come in sooner that the first available due to her insurance preferably with the next month or month and a half per pt. Please advise.

## 2012-03-31 NOTE — Telephone Encounter (Signed)
I have found an open appt and left a message for the patient to call back.

## 2012-04-05 ENCOUNTER — Ambulatory Visit (INDEPENDENT_AMBULATORY_CARE_PROVIDER_SITE_OTHER): Payer: Managed Care, Other (non HMO) | Admitting: Family Medicine

## 2012-04-05 ENCOUNTER — Encounter: Payer: Self-pay | Admitting: Family Medicine

## 2012-04-05 VITALS — BP 110/80 | Temp 99.0°F | Wt 116.0 lb

## 2012-04-05 DIAGNOSIS — Q231 Congenital insufficiency of aortic valve: Secondary | ICD-10-CM

## 2012-04-05 DIAGNOSIS — G4700 Insomnia, unspecified: Secondary | ICD-10-CM

## 2012-04-05 MED ORDER — AMITRIPTYLINE HCL 25 MG PO TABS: 25.0000 mg | ORAL_TABLET | Freq: Every day | ORAL | Status: DC

## 2012-04-05 NOTE — Progress Notes (Signed)
  Subjective:    Patient ID: Tylan Briguglio, female    DOB: 1976/01/07, 36 y.o.   MRN: [id number]  HPI Dawnetta is a delightful 36 year old single female smoker who comes in today for 3 issues  She would like to quit smoking and would like to discuss the Chantix program  She's tried it before however when she takes Ambien for sleep causes vivid dreams.  She's also having difficulty sleeping. She tends to go to sleep okay but then wakes up and can't go back to sleep. 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 She also works weekends. She's not doing any physical activity she's not going out she's not dating.  She has a history of a bicuspid aortic valve and occasional palpitations only drinks minimal amounts of caffeine once a day in the morning she takes the Corgard only very rarely   Review of Systems    general psychiatric review of systems otherwise negative no suicidal ideation Objective:   Physical Exam  Well-developed well-nourished female no acute distress she's oriented x3 appropriate does not appear to be depressed. She does appear to be sad about her life. She single her family lives in [place] and she does not socialize to do any physical activities. 2 dogs one died recently     cardiac exam normal except for a slight murmur from the bicuspid aortic valve Assessment & Plan:  Healthy female  Sleep dysfunction start Elavil 25 mg each bedtime  Tobacco abuse begin the Chantix program  Also advised to decrease her work day begin an exercise program and to join the [employer]

## 2012-04-05 NOTE — Patient Instructions (Signed)
Restart the Chantix program by taking a half a tablet daily  Taper as outlined  Start the Elavil 25 mg at bedtime  Limit your work today as we discussed  Call  the [employer],,,,,,,,,,,,,,,,,, Yates Decamp is your contact person  Return in one month             sooner if any problems

## 2012-04-10 ENCOUNTER — Other Ambulatory Visit: Payer: Self-pay | Admitting: Family Medicine

## 2012-04-10 DIAGNOSIS — R011 Cardiac murmur, unspecified: Secondary | ICD-10-CM

## 2012-04-13 ENCOUNTER — Other Ambulatory Visit (HOSPITAL_COMMUNITY): Payer: Managed Care, Other (non HMO)

## 2012-04-14 ENCOUNTER — Ambulatory Visit
Admission: RE | Admit: 2012-04-14 | Discharge: 2012-04-14 | Disposition: A | Discharge status: Home / Self Care | Payer: Managed Care, Other (non HMO) | Source: Ambulatory Visit | Attending: Family Medicine | Admitting: Family Medicine

## 2012-04-14 DIAGNOSIS — Z1231 Encounter for screening mammogram for malignant neoplasm of breast: Secondary | ICD-10-CM

## 2012-04-17 ENCOUNTER — Telehealth: Payer: Self-pay | Admitting: Family Medicine

## 2012-04-17 NOTE — Telephone Encounter (Signed)
Pt called and had a digital MM done Friday 04/14/12 and pt is needing to get an MRI ordered. Pt also has a question re: getting back on the Ambien and question re: new med amitriptyline (ELAVIL) 25 MG tablet

## 2012-04-18 ENCOUNTER — Other Ambulatory Visit (HOSPITAL_COMMUNITY): Payer: Managed Care, Other (non HMO)

## 2012-04-19 ENCOUNTER — Telehealth: Payer: Self-pay | Admitting: Family Medicine

## 2012-04-19 DIAGNOSIS — Z803 Family history of malignant neoplasm of breast: Secondary | ICD-10-CM

## 2012-04-19 NOTE — Telephone Encounter (Signed)
Kathleen Rubio please refer Kathleen Rubio for genetic testing her both her mother and her maternal grandmother had breast cancer maternal grandmother at age 36,,,,,,,,,,,,,,, mother at age 50.

## 2012-04-19 NOTE — Telephone Encounter (Signed)
Referral request sent 

## 2012-04-19 NOTE — Telephone Encounter (Signed)
Patient called stating that [facility] stated that they changed their process and she will have to be referred by her PCP for the genetic testing. Please advise.

## 2012-04-21 ENCOUNTER — Other Ambulatory Visit: Payer: Self-pay

## 2012-04-21 ENCOUNTER — Ambulatory Visit (HOSPITAL_COMMUNITY): Payer: Managed Care, Other (non HMO) | Attending: Cardiology | Admitting: Radiology

## 2012-04-21 DIAGNOSIS — R079 Chest pain, unspecified: Secondary | ICD-10-CM | POA: Insufficient documentation

## 2012-04-21 DIAGNOSIS — I059 Rheumatic mitral valve disease, unspecified: Secondary | ICD-10-CM | POA: Insufficient documentation

## 2012-04-21 DIAGNOSIS — R011 Cardiac murmur, unspecified: Secondary | ICD-10-CM

## 2012-04-21 DIAGNOSIS — Q231 Congenital insufficiency of aortic valve: Secondary | ICD-10-CM

## 2012-04-21 NOTE — Progress Notes (Signed)
Echocardiogram performed.  

## 2012-04-24 ENCOUNTER — Telehealth: Payer: Self-pay | Admitting: *Deleted

## 2012-04-24 NOTE — Telephone Encounter (Signed)
Confirmed 05/16/12 genetic appt w/ pt.

## 2012-04-28 ENCOUNTER — Other Ambulatory Visit (INDEPENDENT_AMBULATORY_CARE_PROVIDER_SITE_OTHER): Payer: Managed Care, Other (non HMO)

## 2012-04-28 DIAGNOSIS — Z0000 Encounter for general adult medical examination without abnormal findings: Secondary | ICD-10-CM

## 2012-04-28 LAB — POCT URINALYSIS DIPSTICK
Bilirubin, UA: NEGATIVE
Glucose, UA: NEGATIVE
Ketones, UA: NEGATIVE
Leukocytes, UA: NEGATIVE
Nitrite, UA: NEGATIVE
Protein, UA: NEGATIVE
Spec Grav, UA: 1.02
Urobilinogen, UA: 0.2
pH, UA: 6

## 2012-04-28 LAB — LIPID PANEL
Cholesterol: 255 mg/dL — ABNORMAL HIGH (ref 0–200)
HDL: 113.9 mg/dL (ref >39.00)
Total CHOL/HDL Ratio: 2
Triglycerides: 149 mg/dL (ref 0.0–149.0)
VLDL: 29.8 mg/dL (ref 0.0–40.0)

## 2012-04-28 LAB — BASIC METABOLIC PANEL
BUN: 13 mg/dL (ref 6–23)
CO2: 22 mEq/L (ref 19–32)
Calcium: 9.4 mg/dL (ref 8.4–10.5)
Chloride: 103 mEq/L (ref 96–112)
Creatinine, Ser: 0.7 mg/dL (ref 0.4–1.2)
GFR: 103.95 mL/min (ref >60.00)
Glucose, Bld: 85 mg/dL (ref 70–99)
Potassium: 3.7 mEq/L (ref 3.5–5.1)
Sodium: 136 mEq/L (ref 135–145)

## 2012-04-28 LAB — HEPATIC FUNCTION PANEL
ALT: 11 U/L (ref 0–35)
AST: 21 U/L (ref 0–37)
Albumin: 4.2 g/dL (ref 3.5–5.2)
Alkaline Phosphatase: 41 U/L (ref 39–117)
Bilirubin, Direct: 0 mg/dL (ref 0.0–0.3)
Total Bilirubin: 0.6 mg/dL (ref 0.3–1.2)
Total Protein: 7.2 g/dL (ref 6.0–8.3)

## 2012-04-28 LAB — CBC WITH DIFFERENTIAL/PLATELET
Basophils Absolute: 0 10*3/uL (ref 0.0–0.1)
Basophils Relative: 0.6 % (ref 0.0–3.0)
Eosinophils Absolute: 0.1 10*3/uL (ref 0.0–0.7)
Eosinophils Relative: 1.7 % (ref 0.0–5.0)
HCT: 41.4 % (ref 36.0–46.0)
Hemoglobin: 13.9 g/dL (ref 12.0–15.0)
Lymphocytes Relative: 23.7 % (ref 12.0–46.0)
Lymphs Abs: 1.6 10*3/uL (ref 0.7–4.0)
MCHC: 33.6 g/dL (ref 30.0–36.0)
MCV: 104 fl — ABNORMAL HIGH (ref 78.0–100.0)
Monocytes Absolute: 0.4 10*3/uL (ref 0.1–1.0)
Monocytes Relative: 6.5 % (ref 3.0–12.0)
Neutro Abs: 4.7 10*3/uL (ref 1.4–7.7)
Neutrophils Relative %: 67.5 % (ref 43.0–77.0)
Platelets: 277 10*3/uL (ref 150.0–400.0)
RBC: 3.98 Mil/uL (ref 3.87–5.11)
RDW: 12.6 % (ref 11.5–14.6)
WBC: 6.9 10*3/uL (ref 4.5–10.5)

## 2012-04-28 LAB — LDL CHOLESTEROL, DIRECT: Direct LDL: 117.1 mg/dL

## 2012-04-28 LAB — TSH: TSH: 1.91 u[IU]/mL (ref 0.35–5.50)

## 2012-04-29 LAB — HIV ANTIBODY (ROUTINE TESTING W REFLEX): HIV: NONREACTIVE

## 2012-04-29 LAB — RPR

## 2012-05-03 ENCOUNTER — Telehealth: Payer: Self-pay | Admitting: *Deleted

## 2012-05-03 MED ORDER — VALACYCLOVIR HCL 1 G PO TABS: 1000.0000 mg | ORAL_TABLET | Freq: Two times a day (BID) | ORAL | Status: DC

## 2012-05-03 NOTE — Telephone Encounter (Signed)
Please call patient and inform her that the prescription has been sent - thanks-

## 2012-05-04 ENCOUNTER — Other Ambulatory Visit (HOSPITAL_COMMUNITY)
Admission: RE | Admit: 2012-05-04 | Discharge: 2012-05-04 | Disposition: A | Discharge status: Home / Self Care | Payer: Managed Care, Other (non HMO) | Source: Ambulatory Visit | Attending: Family Medicine | Admitting: Family Medicine

## 2012-05-04 ENCOUNTER — Encounter: Payer: Self-pay | Admitting: Family Medicine

## 2012-05-04 ENCOUNTER — Ambulatory Visit (INDEPENDENT_AMBULATORY_CARE_PROVIDER_SITE_OTHER): Payer: Managed Care, Other (non HMO) | Admitting: Family Medicine

## 2012-05-04 VITALS — BP 120/80 | Temp 98.7°F | Ht 65.0 in | Wt 120.0 lb

## 2012-05-04 DIAGNOSIS — Z309 Encounter for contraceptive management, unspecified: Secondary | ICD-10-CM

## 2012-05-04 DIAGNOSIS — Z01419 Encounter for gynecological examination (general) (routine) without abnormal findings: Secondary | ICD-10-CM | POA: Insufficient documentation

## 2012-05-04 DIAGNOSIS — L708 Other acne: Secondary | ICD-10-CM

## 2012-05-04 DIAGNOSIS — G4700 Insomnia, unspecified: Secondary | ICD-10-CM

## 2012-05-04 MED ORDER — CEPHALEXIN 500 MG PO CAPS: ORAL_CAPSULE | ORAL | Status: DC

## 2012-05-04 MED ORDER — MEDROXYPROGESTERONE ACETATE 150 MG/ML IM SUSP
150.0000 mg | Freq: Once | INTRAMUSCULAR | Status: AC
  Administered 2012-05-04: 150 mg via INTRAMUSCULAR

## 2012-05-04 NOTE — Progress Notes (Signed)
  Subjective:    Patient ID: Kathleen Rubio, female    DOB: Dec 28, 1975, 36 y.o.   MRN: [id number]  Kathleen Rubio is a 36 year old single female who comes in today for general physical examination  She has a history of a bicuspid aortic valve however we did an echocardiogram and indeed your exam was normal.  She has a history of cystic acne and has recurrent actiniform lesions on her chest in her groin  She has a history of insomnia and we have her on Elavil 25 mg each bedtime and she's sleeping well  She's down to 4 cigarettes daily on Chantix one half tab daily.  She is due to go to the genetic center [facility] for evaluation because of a positive family history of breast cancer in her family    Review of Systems  Constitutional: Negative.   HENT: Negative.   Eyes: Negative.   Respiratory: Negative.   Cardiovascular: Negative.   Gastrointestinal: Negative.   Genitourinary: Negative.   Musculoskeletal: Negative.   Neurological: Negative.   Hematological: Negative.   Psychiatric/Behavioral: Negative.        Objective:   Physical Exam  Constitutional: She appears well-developed and well-nourished.  HENT:  Head: Normocephalic and atraumatic.  Right Ear: External ear normal.  Left Ear: External ear normal.  Nose: Nose normal.  Mouth/Throat: Oropharynx is clear and moist.  Eyes: EOM are normal. Pupils are equal, round, and reactive to light.  Neck: Normal range of motion. Neck supple. No thyromegaly present.  Cardiovascular: Normal rate, regular rhythm, normal heart sounds and intact distal pulses.  Exam reveals no gallop and no friction rub.   No murmur heard. Pulmonary/Chest: Effort normal and breath sounds normal.  Abdominal: Soft. Bowel sounds are normal. She exhibits no distension and no mass. There is no tenderness. There is no rebound.  Genitourinary: Vagina normal and uterus normal. Guaiac negative stool. No vaginal discharge found.       Bilateral breast exam normal    Musculoskeletal: Normal range of motion.  Lymphadenopathy:    She has no cervical adenopathy.  Neurological: She is alert. She has normal reflexes. No cranial nerve deficit. She exhibits normal muscle tone. Coordination normal.  Skin: Skin is warm and dry.  Psychiatric: She has a normal mood and affect. Her behavior is normal. Judgment and thought content normal.          Assessment & Plan:  Healthy female  Insomnia continue Elavil  Normal heart no valvular abnormality  History of tobacco abuse continue tapering and using the Chantix  Cystic acne Keflex 2 tabs twice a day

## 2012-05-04 NOTE — Patient Instructions (Addendum)
Take the Keflex 2 tabs twice daily for flares of the cystic acne also use warm soaks as we outlined  Continue the Elavil 25 mg each bedtime for sleep dysfunction  Continue tapering off the nicotine take the Chantix one half tab daily for 2 months after you stop smoking completely  Return in one year sooner if any problems

## 2012-05-15 ENCOUNTER — Telehealth: Payer: Self-pay | Admitting: *Deleted

## 2012-05-15 ENCOUNTER — Telehealth: Payer: Self-pay | Admitting: Family Medicine

## 2012-05-15 NOTE — Telephone Encounter (Signed)
Pt would like a return call regarding her echo from 04/21/12. Has some questions for you. Thanks.

## 2012-05-15 NOTE — Telephone Encounter (Signed)
Confirmed rescheduled appt 9/3 genetic appt w/ pt.

## 2012-05-16 ENCOUNTER — Encounter: Payer: Managed Care, Other (non HMO) | Admitting: Genetic Counselor

## 2012-05-16 ENCOUNTER — Other Ambulatory Visit: Payer: Managed Care, Other (non HMO) | Admitting: Lab

## 2012-05-16 NOTE — Telephone Encounter (Signed)
Please call echocardiogram normal

## 2012-05-16 NOTE — Telephone Encounter (Signed)
Left message on machine for patient with echo result

## 2012-05-23 ENCOUNTER — Telehealth: Payer: Self-pay | Admitting: *Deleted

## 2012-05-23 ENCOUNTER — Ambulatory Visit: Payer: Managed Care, Other (non HMO) | Admitting: Genetic Counselor

## 2012-05-23 ENCOUNTER — Other Ambulatory Visit: Payer: Managed Care, Other (non HMO) | Admitting: Lab

## 2012-05-23 NOTE — Progress Notes (Signed)
Patient did not show for her appointment.

## 2012-05-23 NOTE — Telephone Encounter (Signed)
Patient left me a message stating that her car broke down and cannot make her appt.  I called a left a message for the pt to call me back to reschedule.

## 2012-06-16 ENCOUNTER — Other Ambulatory Visit: Payer: Self-pay | Admitting: Family Medicine

## 2012-06-21 ENCOUNTER — Other Ambulatory Visit: Payer: Self-pay | Admitting: *Deleted

## 2012-06-21 NOTE — Telephone Encounter (Signed)
Patient requested a refill of Ambien.  I offered to send in Amtriptiyline 50mg .  She is already taking 2 - 25mg  of amtriptiyline at bedtime, but is not working well. Suggestions?

## 2012-06-22 MED ORDER — ZOLPIDEM TARTRATE 5 MG PO TABS: ORAL_TABLET | ORAL | Status: DC

## 2012-06-22 NOTE — Telephone Encounter (Signed)
Rx called in and patient is aware 

## 2012-06-22 NOTE — Telephone Encounter (Signed)
Ambien 5 mg,,,,,,,,,, one half tablet each bedtime dispense 30 refills x4

## 2012-06-23 ENCOUNTER — Other Ambulatory Visit: Payer: Self-pay | Admitting: Family Medicine

## 2012-07-07 ENCOUNTER — Telehealth: Payer: Self-pay | Admitting: Family Medicine

## 2012-07-07 MED ORDER — ZOLPIDEM TARTRATE 5 MG PO TABS: 5.0000 mg | ORAL_TABLET | Freq: Every evening | ORAL | Status: DC | PRN

## 2012-07-07 NOTE — Telephone Encounter (Signed)
Caller: [name]/Patient; Patient Name: Kathleen Rubio; PCP: Kelle Darting [facility]); Best Callback Phone Number: [phone]; Reason for call: Per EPIC chart,  Dr. Tawanna Cooler refilled the Ambien RX  06/22/12 for 5 mg to take .5 tab at bedtime, # 30 pills with 4 refills.  Pt states she is not able to get to sleep with the .25 mg only every night.  Previously was on a 10 mg pills and and was taking .5 tab Q HS for 5 mg per night.  States she wants to increase the pills she has to Ambien 5 mg  1 PO Q HS,  or get a new prescription to the pharmacy with new instructions fof Ambien 5 mg 1 PO Q HS.  Please call pt to advise.  Pt is having no other medical symptoms.

## 2012-07-07 NOTE — Telephone Encounter (Signed)
Okay per Dr Raeford Razor 5mg  1 qhs #30 4 rf. Called in.

## 2012-07-17 ENCOUNTER — Ambulatory Visit (INDEPENDENT_AMBULATORY_CARE_PROVIDER_SITE_OTHER): Payer: Managed Care, Other (non HMO) | Admitting: Family Medicine

## 2012-07-17 DIAGNOSIS — Z23 Encounter for immunization: Secondary | ICD-10-CM

## 2012-07-17 DIAGNOSIS — Z309 Encounter for contraceptive management, unspecified: Secondary | ICD-10-CM

## 2012-07-17 MED ORDER — MEDROXYPROGESTERONE ACETATE 150 MG/ML IM SUSP
150.0000 mg | Freq: Once | INTRAMUSCULAR | Status: AC
  Administered 2012-07-17: 150 mg via INTRAMUSCULAR

## 2012-11-15 ENCOUNTER — Other Ambulatory Visit: Payer: Self-pay | Admitting: Family Medicine

## 2012-11-23 ENCOUNTER — Telehealth: Payer: Self-pay | Admitting: Family Medicine

## 2012-11-23 NOTE — Telephone Encounter (Signed)
ok 

## 2012-11-23 NOTE — Telephone Encounter (Signed)
Pt would like a full panel of labs including HIV done on her [facility] lab appt 04/30/13.

## 2012-12-14 ENCOUNTER — Ambulatory Visit (INDEPENDENT_AMBULATORY_CARE_PROVIDER_SITE_OTHER): Payer: Managed Care, Other (non HMO) | Admitting: *Deleted

## 2012-12-14 DIAGNOSIS — Z309 Encounter for contraceptive management, unspecified: Secondary | ICD-10-CM

## 2012-12-14 MED ORDER — MEDROXYPROGESTERONE ACETATE 150 MG/ML IM SUSP
150.0000 mg | Freq: Once | INTRAMUSCULAR | Status: AC
  Administered 2012-12-14: 150 mg via INTRAMUSCULAR

## 2012-12-19 ENCOUNTER — Ambulatory Visit: Payer: Managed Care, Other (non HMO) | Admitting: *Deleted

## 2013-03-22 ENCOUNTER — Ambulatory Visit: Payer: Managed Care, Other (non HMO) | Admitting: *Deleted

## 2013-04-21 ENCOUNTER — Other Ambulatory Visit: Payer: Self-pay | Admitting: Family Medicine

## 2013-04-25 ENCOUNTER — Other Ambulatory Visit: Payer: Self-pay | Admitting: Family Medicine

## 2013-04-30 ENCOUNTER — Other Ambulatory Visit: Payer: Managed Care, Other (non HMO)

## 2013-05-03 ENCOUNTER — Ambulatory Visit (INDEPENDENT_AMBULATORY_CARE_PROVIDER_SITE_OTHER): Payer: BC Managed Care – PPO | Admitting: *Deleted

## 2013-05-03 DIAGNOSIS — Z309 Encounter for contraceptive management, unspecified: Secondary | ICD-10-CM

## 2013-05-03 LAB — POCT URINE PREGNANCY: Preg Test, Ur: NEGATIVE

## 2013-05-03 MED ORDER — ZOLPIDEM TARTRATE 10 MG PO TABS: ORAL_TABLET | ORAL | Status: DC

## 2013-05-03 MED ORDER — MEDROXYPROGESTERONE ACETATE 150 MG/ML IM SUSP
150.0000 mg | Freq: Once | INTRAMUSCULAR | Status: AC
  Administered 2013-05-03: 150 mg via INTRAMUSCULAR

## 2013-05-03 MED ORDER — AMITRIPTYLINE HCL 25 MG PO TABS: ORAL_TABLET | ORAL | Status: DC

## 2013-05-07 ENCOUNTER — Encounter: Payer: Managed Care, Other (non HMO) | Admitting: Family Medicine

## 2013-06-29 ENCOUNTER — Ambulatory Visit (INDEPENDENT_AMBULATORY_CARE_PROVIDER_SITE_OTHER): Payer: BC Managed Care – PPO | Admitting: *Deleted

## 2013-06-29 DIAGNOSIS — Z23 Encounter for immunization: Secondary | ICD-10-CM

## 2013-06-29 DIAGNOSIS — Z2911 Encounter for prophylactic immunotherapy for respiratory syncytial virus (RSV): Secondary | ICD-10-CM

## 2013-07-17 ENCOUNTER — Other Ambulatory Visit: Payer: BC Managed Care – PPO

## 2013-07-24 ENCOUNTER — Ambulatory Visit (INDEPENDENT_AMBULATORY_CARE_PROVIDER_SITE_OTHER): Payer: BC Managed Care – PPO | Admitting: Family Medicine

## 2013-07-24 ENCOUNTER — Encounter: Payer: Self-pay | Admitting: Family Medicine

## 2013-07-24 ENCOUNTER — Other Ambulatory Visit (HOSPITAL_COMMUNITY)
Admission: RE | Admit: 2013-07-24 | Discharge: 2013-07-24 | Disposition: A | Discharge status: Home / Self Care | Payer: BC Managed Care – PPO | Source: Ambulatory Visit | Attending: Family Medicine | Admitting: Family Medicine

## 2013-07-24 DIAGNOSIS — J309 Allergic rhinitis, unspecified: Secondary | ICD-10-CM

## 2013-07-24 DIAGNOSIS — Z01419 Encounter for gynecological examination (general) (routine) without abnormal findings: Secondary | ICD-10-CM | POA: Insufficient documentation

## 2013-07-24 DIAGNOSIS — G4700 Insomnia, unspecified: Secondary | ICD-10-CM

## 2013-07-24 DIAGNOSIS — Z803 Family history of malignant neoplasm of breast: Secondary | ICD-10-CM

## 2013-07-24 DIAGNOSIS — Q231 Congenital insufficiency of aortic valve: Secondary | ICD-10-CM

## 2013-07-24 DIAGNOSIS — Z309 Encounter for contraceptive management, unspecified: Secondary | ICD-10-CM

## 2013-07-24 DIAGNOSIS — L708 Other acne: Secondary | ICD-10-CM

## 2013-07-24 MED ORDER — AMITRIPTYLINE HCL 25 MG PO TABS
ORAL_TABLET | ORAL | Status: DC
Start: 2013-07-24 — End: 2013-12-10

## 2013-07-24 MED ORDER — LORAZEPAM 0.5 MG PO TABS: ORAL_TABLET | ORAL | Status: DC

## 2013-07-24 MED ORDER — VARENICLINE TARTRATE 1 MG PO TABS: ORAL_TABLET | ORAL | Status: DC

## 2013-07-24 MED ORDER — MEDROXYPROGESTERONE ACETATE 150 MG/ML IM SUSP
150.0000 mg | Freq: Once | INTRAMUSCULAR | Status: AC
  Administered 2013-07-24: 150 mg via INTRAMUSCULAR

## 2013-07-24 NOTE — Progress Notes (Signed)
  Subjective:    Patient ID: Kathleen Rubio, female    DOB: 11-16-1975, 37 y.o.   MRN: [id number]  HPI Mishayla is a 37 year old single female smoker........ down to 5 cigarettes a day with the Chantix program,,,,,, G0 P0 who comes in today for her annual physical examination  She takes Elavil 25 mg each bedtime for sleep dysfunction  She's on Chantix one half tab daily and has cut her cigarettes down to 5 cigarettes a day. She is also exercising with a personal trainer  Depo-Provera shot today  Also she has some difficulty sleeping sometime in the Elavil doesn't work. We will switch from the Ambien to low-dose Ativan when necessary  Her mother's mother and her mother both had breast cancer in her 54s. I recommended Marlise Eves at the cancer Center to review her genetic risk factors   Review of Systems  Constitutional: Negative.   HENT: Negative.   Eyes: Negative.   Respiratory: Negative.   Cardiovascular: Negative.   Gastrointestinal: Negative.   Genitourinary: Negative.   Musculoskeletal: Negative.   Neurological: Negative.   Psychiatric/Behavioral: Negative.        Objective:   Physical Exam  Constitutional: She appears well-developed and well-nourished.  HENT:  Head: Normocephalic and atraumatic.  Right Ear: External ear normal.  Left Ear: External ear normal.  Nose: Nose normal.  Mouth/Throat: Oropharynx is clear and moist.  Eyes: EOM are normal. Pupils are equal, round, and reactive to light.  Neck: Normal range of motion. Neck supple. No thyromegaly present.  Cardiovascular: Normal rate, regular rhythm, normal heart sounds and intact distal pulses.  Exam reveals no gallop and no friction rub.   No murmur heard. Pulmonary/Chest: Effort normal and breath sounds normal.  Abdominal: Soft. Bowel sounds are normal. She exhibits no distension and no mass. There is no tenderness. There is no rebound.  Genitourinary: Vagina normal and uterus normal. Guaiac negative stool. No  vaginal discharge found.  Bilateral breast exam normal she has a lot of old acne scars with some cystic changes.  Musculoskeletal: Normal range of motion.  Lymphadenopathy:    She has no cervical adenopathy.  Neurological: She is alert. She has normal reflexes. No cranial nerve deficit. She exhibits normal muscle tone. Coordination normal.  Skin: Skin is warm and dry.  Psychiatric: She has a normal mood and affect. Her behavior is normal. Judgment and thought content normal.          Assessment & Plan:  Healthy female  Insomnia continue Elavil each bedtime Ativan when necessary  Tobacco abuse continue Chantix and tapering cigarettes  Positive family history of breast cancer consult with Maylon Cos at the genetic center

## 2013-07-24 NOTE — Patient Instructions (Signed)
Call the genetics Center and make an appointment to see Kathleen Rubio phone number [phone]  Continue the Chantix one half tab daily and taper by one per week. When you quit smoking then take the Chantix a half a tablet daily for 2 months  Remember to do a thorough breast exam monthly, and you mammography,  Stop the Ambien  Ativan 0.5 each bedtime when necessary

## 2013-07-26 ENCOUNTER — Telehealth: Payer: Self-pay | Admitting: Genetic Counselor

## 2013-07-26 NOTE — Telephone Encounter (Signed)
S/W PT IN REF TO GENETIC APPT. ON 11/01/13@9 :00 REFERRING DR TODD MAILED NP PACKET

## 2013-08-03 ENCOUNTER — Telehealth: Payer: Self-pay | Admitting: *Deleted

## 2013-08-03 NOTE — Telephone Encounter (Signed)
Patient is calling because she has tried the diazepam and it is not working. She would like a rx for Ambien.  Is this okay to fill?

## 2013-08-06 MED ORDER — TRAZODONE HCL 50 MG PO TABS: 25.0000 mg | ORAL_TABLET | Freq: Every evening | ORAL | Status: DC | PRN

## 2013-08-06 NOTE — Telephone Encounter (Signed)
Rx sent to pharmacy and Left message on machine for patient 

## 2013-08-06 NOTE — Telephone Encounter (Signed)
Per Dr Tawanna Cooler try Trazodone half tab two weeks and one tab two weeks 100 2 refills

## 2013-11-01 ENCOUNTER — Ambulatory Visit: Payer: BC Managed Care – PPO | Admitting: Genetic Counselor

## 2013-11-01 ENCOUNTER — Other Ambulatory Visit: Payer: BC Managed Care – PPO

## 2013-11-01 NOTE — Progress Notes (Signed)
This is the second appointment the patient did not show up for (first was 06/02/12).  If the patient is interested in rescheduling the appointment, please have her contact our office.

## 2013-11-14 ENCOUNTER — Other Ambulatory Visit: Payer: Self-pay | Admitting: Family Medicine

## 2013-11-30 ENCOUNTER — Ambulatory Visit: Payer: BC Managed Care – PPO | Admitting: *Deleted

## 2013-11-30 ENCOUNTER — Other Ambulatory Visit: Payer: Self-pay | Admitting: Family Medicine

## 2013-12-07 ENCOUNTER — Ambulatory Visit (INDEPENDENT_AMBULATORY_CARE_PROVIDER_SITE_OTHER): Payer: BC Managed Care – PPO | Admitting: *Deleted

## 2013-12-07 ENCOUNTER — Telehealth: Payer: Self-pay | Admitting: *Deleted

## 2013-12-07 DIAGNOSIS — Z309 Encounter for contraceptive management, unspecified: Secondary | ICD-10-CM

## 2013-12-07 DIAGNOSIS — L708 Other acne: Secondary | ICD-10-CM

## 2013-12-07 MED ORDER — CEPHALEXIN 500 MG PO CAPS: ORAL_CAPSULE | ORAL | Status: DC

## 2013-12-07 MED ORDER — MEDROXYPROGESTERONE ACETATE 150 MG/ML IM SUSP
150.0000 mg | Freq: Once | INTRAMUSCULAR | Status: AC
Start: 2013-12-07 — End: 2013-12-07
  Administered 2013-12-07: 150 mg via INTRAMUSCULAR

## 2013-12-07 NOTE — Telephone Encounter (Signed)
Patient states that the 2 medications she is taking at night to help her sleep are not working.  She would like to go back to Ambien 5 mg and take half a tab at bedtime.  Is this okay to fill?

## 2013-12-10 MED ORDER — ZOLPIDEM TARTRATE 5 MG PO TABS: ORAL_TABLET | ORAL | Status: DC

## 2013-12-10 NOTE — Addendum Note (Signed)
Addended by: Kern ReapVEREEN, Kostantinos Tallman B on: 12/10/2013 09:43 AM   Modules accepted: Orders, Medications

## 2013-12-10 NOTE — Telephone Encounter (Signed)
Rx called into pharmacy.  Left message on machine for patient. 

## 2013-12-10 NOTE — Telephone Encounter (Signed)
Okay to go back on Ambien 5 mg.......... one half tab each bedtime....... dispense 60 tabs 2 refills please

## 2014-03-05 ENCOUNTER — Other Ambulatory Visit: Payer: Self-pay

## 2014-03-05 DIAGNOSIS — Z1231 Encounter for screening mammogram for malignant neoplasm of breast: Secondary | ICD-10-CM

## 2014-03-08 ENCOUNTER — Ambulatory Visit
Admission: RE | Admit: 2014-03-08 | Discharge: 2014-03-08 | Disposition: A | Discharge status: Home / Self Care | Payer: BC Managed Care – PPO | Source: Ambulatory Visit

## 2014-03-08 ENCOUNTER — Encounter (INDEPENDENT_AMBULATORY_CARE_PROVIDER_SITE_OTHER): Payer: Self-pay

## 2014-03-08 ENCOUNTER — Ambulatory Visit (INDEPENDENT_AMBULATORY_CARE_PROVIDER_SITE_OTHER): Payer: BC Managed Care – PPO | Admitting: *Deleted

## 2014-03-08 DIAGNOSIS — Z1231 Encounter for screening mammogram for malignant neoplasm of breast: Secondary | ICD-10-CM

## 2014-03-08 DIAGNOSIS — Z309 Encounter for contraceptive management, unspecified: Secondary | ICD-10-CM

## 2014-03-08 MED ORDER — MEDROXYPROGESTERONE ACETATE 150 MG/ML IM SUSP
150.0000 mg | Freq: Once | INTRAMUSCULAR | Status: AC
  Administered 2014-03-08: 150 mg via INTRAMUSCULAR

## 2014-05-31 ENCOUNTER — Ambulatory Visit (INDEPENDENT_AMBULATORY_CARE_PROVIDER_SITE_OTHER): Payer: BC Managed Care – PPO | Admitting: *Deleted

## 2014-05-31 DIAGNOSIS — Z309 Encounter for contraceptive management, unspecified: Secondary | ICD-10-CM

## 2014-05-31 DIAGNOSIS — Z23 Encounter for immunization: Secondary | ICD-10-CM

## 2014-05-31 MED ORDER — ZOLPIDEM TARTRATE 5 MG PO TABS: ORAL_TABLET | ORAL | Status: DC

## 2014-05-31 MED ORDER — MEDROXYPROGESTERONE ACETATE 150 MG/ML IM SUSP
150.0000 mg | Freq: Once | INTRAMUSCULAR | Status: AC
Start: 2014-05-31 — End: 2014-05-31
  Administered 2014-05-31: 150 mg via INTRAMUSCULAR

## 2014-07-17 ENCOUNTER — Other Ambulatory Visit (INDEPENDENT_AMBULATORY_CARE_PROVIDER_SITE_OTHER): Payer: BC Managed Care – PPO

## 2014-07-17 DIAGNOSIS — R7989 Other specified abnormal findings of blood chemistry: Secondary | ICD-10-CM

## 2014-07-17 DIAGNOSIS — Z0000 Encounter for general adult medical examination without abnormal findings: Secondary | ICD-10-CM

## 2014-07-17 LAB — CBC WITH DIFFERENTIAL/PLATELET
Basophils Absolute: 0 10*3/uL (ref 0.0–0.1)
Basophils Relative: 0.5 % (ref 0.0–3.0)
Eosinophils Absolute: 0.1 10*3/uL (ref 0.0–0.7)
Eosinophils Relative: 2.2 % (ref 0.0–5.0)
HCT: 37.3 % (ref 36.0–46.0)
Hemoglobin: 12.7 g/dL (ref 12.0–15.0)
Lymphocytes Relative: 26.4 % (ref 12.0–46.0)
Lymphs Abs: 1.4 10*3/uL (ref 0.7–4.0)
MCHC: 34.2 g/dL (ref 30.0–36.0)
MCV: 107.1 fl — ABNORMAL HIGH (ref 78.0–100.0)
Monocytes Absolute: 0.4 10*3/uL (ref 0.1–1.0)
Monocytes Relative: 6.9 % (ref 3.0–12.0)
Neutro Abs: 3.4 10*3/uL (ref 1.4–7.7)
Neutrophils Relative %: 64 % (ref 43.0–77.0)
Platelets: 372 10*3/uL (ref 150.0–400.0)
RBC: 3.48 Mil/uL — ABNORMAL LOW (ref 3.87–5.11)
RDW: 13.8 % (ref 11.5–15.5)
WBC: 5.3 10*3/uL (ref 4.0–10.5)

## 2014-07-17 LAB — LIPID PANEL
Cholesterol: 346 mg/dL — ABNORMAL HIGH (ref 0–200)
HDL: 85.8 mg/dL (ref >39.00)
NonHDL: 260.2
Total CHOL/HDL Ratio: 4
Triglycerides: 759 mg/dL — ABNORMAL HIGH (ref 0.0–149.0)
VLDL: 151.8 mg/dL — ABNORMAL HIGH (ref 0.0–40.0)

## 2014-07-17 LAB — HEPATIC FUNCTION PANEL
ALT: 19 U/L (ref 0–35)
AST: 32 U/L (ref 0–37)
Albumin: 3.4 g/dL — ABNORMAL LOW (ref 3.5–5.2)
Alkaline Phosphatase: 68 U/L (ref 39–117)
Bilirubin, Direct: 0 mg/dL (ref 0.0–0.3)
Total Bilirubin: 0.8 mg/dL (ref 0.2–1.2)
Total Protein: 7.2 g/dL (ref 6.0–8.3)

## 2014-07-17 LAB — BASIC METABOLIC PANEL
BUN: 7 mg/dL (ref 6–23)
CO2: 25 mEq/L (ref 19–32)
Calcium: 9.5 mg/dL (ref 8.4–10.5)
Chloride: 102 mEq/L (ref 96–112)
Creatinine, Ser: 0.8 mg/dL (ref 0.4–1.2)
GFR: 90.33 mL/min (ref >60.00)
Glucose, Bld: 105 mg/dL — ABNORMAL HIGH (ref 70–99)
Potassium: 3.7 mEq/L (ref 3.5–5.1)
Sodium: 135 mEq/L (ref 135–145)

## 2014-07-17 LAB — POCT URINALYSIS DIPSTICK
Bilirubin, UA: NEGATIVE
Glucose, UA: NEGATIVE
Ketones, UA: NEGATIVE
Leukocytes, UA: NEGATIVE
Nitrite, UA: NEGATIVE
Protein, UA: NEGATIVE
Spec Grav, UA: 1.02
Urobilinogen, UA: 0.2
pH, UA: 6

## 2014-07-17 LAB — TSH: TSH: 1.91 u[IU]/mL (ref 0.35–4.50)

## 2014-07-17 LAB — LDL CHOLESTEROL, DIRECT: Direct LDL: 137.7 mg/dL

## 2014-07-17 NOTE — Addendum Note (Signed)
Addended by: Bonnye FavaKWEI, NANA K on: 07/17/2014 11:12 AM   Modules accepted: Orders

## 2014-07-17 NOTE — Addendum Note (Signed)
Addended by: Alfred LevinsWYRICK, CINDY D on: 07/17/2014 11:54 AM   Modules accepted: Orders

## 2014-07-18 LAB — GC/CHLAMYDIA PROBE AMP, URINE
Chlamydia, Swab/Urine, PCR: NEGATIVE
GC Probe Amp, Urine: NEGATIVE

## 2014-07-18 LAB — RPR

## 2014-07-18 LAB — HIV ANTIBODY (ROUTINE TESTING W REFLEX): HIV 1&2 Ab, 4th Generation: NONREACTIVE

## 2014-07-25 ENCOUNTER — Ambulatory Visit (INDEPENDENT_AMBULATORY_CARE_PROVIDER_SITE_OTHER): Payer: BC Managed Care – PPO | Admitting: Family Medicine

## 2014-07-25 ENCOUNTER — Other Ambulatory Visit (HOSPITAL_COMMUNITY)
Admission: RE | Admit: 2014-07-25 | Discharge: 2014-07-25 | Disposition: A | Discharge status: Home / Self Care | Payer: BC Managed Care – PPO | Source: Ambulatory Visit | Attending: Family Medicine | Admitting: Family Medicine

## 2014-07-25 ENCOUNTER — Encounter: Payer: Self-pay | Admitting: Family Medicine

## 2014-07-25 VITALS — BP 124/84 | Temp 98.4°F | Ht 65.0 in | Wt 121.0 lb

## 2014-07-25 DIAGNOSIS — Q231 Congenital insufficiency of aortic valve: Secondary | ICD-10-CM

## 2014-07-25 DIAGNOSIS — L708 Other acne: Secondary | ICD-10-CM

## 2014-07-25 DIAGNOSIS — Z1151 Encounter for screening for human papillomavirus (HPV): Secondary | ICD-10-CM | POA: Diagnosis present

## 2014-07-25 DIAGNOSIS — L301 Dyshidrosis [pompholyx]: Secondary | ICD-10-CM

## 2014-07-25 DIAGNOSIS — R1032 Left lower quadrant pain: Secondary | ICD-10-CM

## 2014-07-25 DIAGNOSIS — G4700 Insomnia, unspecified: Secondary | ICD-10-CM

## 2014-07-25 DIAGNOSIS — R896 Abnormal cytological findings in specimens from other organs, systems and tissues: Secondary | ICD-10-CM

## 2014-07-25 DIAGNOSIS — Z01419 Encounter for gynecological examination (general) (routine) without abnormal findings: Secondary | ICD-10-CM | POA: Diagnosis present

## 2014-07-25 DIAGNOSIS — J309 Allergic rhinitis, unspecified: Secondary | ICD-10-CM

## 2014-07-25 DIAGNOSIS — E782 Mixed hyperlipidemia: Secondary | ICD-10-CM

## 2014-07-25 DIAGNOSIS — Z803 Family history of malignant neoplasm of breast: Secondary | ICD-10-CM

## 2014-07-25 DIAGNOSIS — R87629 Unspecified abnormal cytological findings in specimens from vagina: Secondary | ICD-10-CM

## 2014-07-25 MED ORDER — LORAZEPAM 0.5 MG PO TABS: ORAL_TABLET | ORAL | Status: DC

## 2014-07-25 MED ORDER — ZOLPIDEM TARTRATE 5 MG PO TABS: ORAL_TABLET | ORAL | Status: DC

## 2014-07-25 MED ORDER — VARENICLINE TARTRATE 1 MG PO TABS: ORAL_TABLET | ORAL | Status: DC

## 2014-07-25 MED ORDER — EPINEPHRINE 0.3 MG/0.3ML IJ SOAJ: 0.3000 mg | Freq: Once | INTRAMUSCULAR | Status: DC

## 2014-07-25 NOTE — Progress Notes (Signed)
Subjective:    Patient ID: Kathleen Rubio, female    DOB: 08/26/1976, 38 y.o.   MRN: [id number]  HPI Kathleen Rubio is a 38 year old single female who comes in today for general physical examination  She gets a DepoCyt shot every 3 months and has no periods  For the past 3 months she's does soreness in her left lower quadrant that comes and goes. She says it's a dull ache last for about 30 minutes and goes away does not wake her up at night. No fever chills weight loss nausea vomiting or diarrhea. She's never had a history of fibroids or ovarian cysts.  She has some tingling of her left foot third fourth and fifth toes occasionally  With the Chantix 0.5 mg she's down to 4 cigarettes per day  The current method of family planning Depo-Provera.. Mother and maternal grandmother both have breast cancer. We will refer her to the genetics center for evaluation. She does BSE monthly and gets annual mammography because of dense breast she gets the feet D  She takes a combination of Ativan poor 5 and Ambien 2.5 mg nightly for sleep either 1 alone will not help her sleep dysfunction at she's tried trazodone to no avail  Vaccinations updated by Fleet Contrasachel   Review of Systems  Constitutional: Negative.   HENT: Negative.   Eyes: Negative.   Respiratory: Negative.   Cardiovascular: Negative.   Gastrointestinal: Negative.   Endocrine: Negative.   Genitourinary: Negative.   Musculoskeletal: Negative.   Skin: Negative.   Allergic/Immunologic: Negative.   Neurological: Negative.   Hematological: Negative.   Psychiatric/Behavioral: Negative.        Objective:   Physical Exam  Constitutional: She appears well-developed and well-nourished.  HENT:  Head: Normocephalic and atraumatic.  Right Ear: External ear normal.  Left Ear: External ear normal.  Nose: Nose normal.  Mouth/Throat: Oropharynx is clear and moist.  Eyes: EOM are normal. Pupils are equal, round, and reactive to light.  Neck: Normal range of  motion. Neck supple. No JVD present. No tracheal deviation present. No thyromegaly present.  Cardiovascular: Normal rate, regular rhythm, normal heart sounds and intact distal pulses.  Exam reveals no gallop and no friction rub.   No murmur heard. Pulmonary/Chest: Effort normal and breath sounds normal. No stridor. No respiratory distress. She has no wheezes. She has no rales. She exhibits no tenderness.  Abdominal: Soft. Bowel sounds are normal. She exhibits no distension and no mass. There is no tenderness. There is no rebound and no guarding.  Genitourinary: Vagina normal and uterus normal. Guaiac negative stool. No vaginal discharge found.  Bilateral breast exam normal she was taught meticulous BSE  Fullness left adnexal area question ovarian cyst..... We'll get ultrasound  Musculoskeletal: Normal range of motion.  Lymphadenopathy:    She has no cervical adenopathy.  Neurological: She is alert. She has normal reflexes. No cranial nerve deficit. She exhibits normal muscle tone. Coordination normal.  Skin: Skin is warm and dry. No rash noted. No erythema. No pallor.  Total body skin exam normal except for some acne on her anterior chest wall  Psychiatric: She has a normal mood and affect. Her behavior is normal. Judgment and thought content normal.          Assessment & Plan:  Healthy female  Positive family history of breast cancer...Marland Kitchen.Marland Kitchen.Marland Kitchen. Mother and maternal grandmother... BSE monthly... And you mammography........ Genetics consult  Insomnia continue the combination of low-dose Ativan and Ambien  Tobacco abuse continue the Chantix  taper off cigarettes completely  Left lower quadrant abdominal pain question ovarian cyst,,,,,,, will send for pelvic ultrasound

## 2014-07-25 NOTE — Patient Instructions (Signed)
Ativan 0.5 and Ambien 2.5 mg nightly for sleep  Chantix one half tab daily or you may increase to one half tablet twice daily to help he stopped smoking continue the Chantix for 2 months after you stop smoking completely  We will get you set up a consult in the genetics clinic  We will also get you set up for an ultrasound for evaluation of the left lower quadrant discomfort you're having

## 2014-07-25 NOTE — Progress Notes (Signed)
Pre visit review using our clinic review tool, if applicable. No additional management support is needed unless otherwise documented below in the visit note. 

## 2014-07-26 ENCOUNTER — Telehealth: Payer: Self-pay | Admitting: Family Medicine

## 2014-07-26 NOTE — Telephone Encounter (Signed)
emmi emailed °

## 2014-07-29 LAB — CYTOLOGY - PAP

## 2014-07-31 ENCOUNTER — Telehealth: Payer: Self-pay | Admitting: Genetic Counselor

## 2014-07-31 ENCOUNTER — Telehealth: Payer: Self-pay | Admitting: Family Medicine

## 2014-07-31 DIAGNOSIS — N83202 Unspecified ovarian cyst, left side: Secondary | ICD-10-CM

## 2014-07-31 NOTE — Telephone Encounter (Signed)
LEFT MESSAGE FOR PATIENT TO RETURN CALL TO SCHEDULE GENETIC APPT.  °

## 2014-07-31 NOTE — Telephone Encounter (Signed)
New order placed

## 2014-07-31 NOTE — Telephone Encounter (Signed)
S/W PATIENT AND GAVE GENETIC APPT FOR 11/12 @ 9 W/GENETIC COUNSELOR

## 2014-07-31 NOTE — Telephone Encounter (Signed)
The order for [facility] needs to be "ultrasound pelvic complete w/ transvaginal"  (not US PELVIS LIMITED) Can you change  May need to be resc if we can get back to he today before 2:00.  Pt is scheduled tomorrow and new order requires more time.

## 2014-08-01 ENCOUNTER — Encounter: Payer: Self-pay | Admitting: Genetic Counselor

## 2014-08-01 ENCOUNTER — Ambulatory Visit (HOSPITAL_BASED_OUTPATIENT_CLINIC_OR_DEPARTMENT_OTHER): Payer: BC Managed Care – PPO | Admitting: Genetic Counselor

## 2014-08-01 ENCOUNTER — Ambulatory Visit
Admission: RE | Admit: 2014-08-01 | Discharge: 2014-08-01 | Disposition: A | Discharge status: Home / Self Care | Payer: BC Managed Care – PPO | Source: Ambulatory Visit | Attending: Family Medicine | Admitting: Family Medicine

## 2014-08-01 ENCOUNTER — Other Ambulatory Visit: Payer: Self-pay | Admitting: Family Medicine

## 2014-08-01 ENCOUNTER — Other Ambulatory Visit: Payer: BC Managed Care – PPO

## 2014-08-01 DIAGNOSIS — R1032 Left lower quadrant pain: Secondary | ICD-10-CM

## 2014-08-01 DIAGNOSIS — Z803 Family history of malignant neoplasm of breast: Secondary | ICD-10-CM

## 2014-08-01 NOTE — Progress Notes (Signed)
Patient Name: Kathleen Rubio Patient Age: 38 y.o. Encounter Date: 08/01/2014  Referring Physician: Evette GeorgesDD,JEFFREY ALLEN, MD  Primary Care Provider: Evette GeorgesDD,JEFFREY ALLEN, MD   Ms. Kathleen Rubio, a 38 y.o. female, is being seen [facility] due to a family history of breast cancer. She presents to clinic today to discuss the possibility of a hereditary predisposition to cancer and discuss whether genetic testing is warranted.  HISTORY OF PRESENT ILLNESS: Ms. Kathleen Rubio has no personal history of cancer and is in generally good health. She stated that she has been having a yearly mammogram since her 7920s due to her family history. She has a yearly gynecologic exam and clinical breast exam.  Past Medical History  Diagnosis Date  . Allergy     allergic rhinitis  . Cystic acne   . Dysfunctional uterine bleeding   . Family history of breast cancer   . Ovarian cyst, left     History   Social History  . Marital Status: Married    Spouse Name: N/A    Number of Children: N/A  . Years of Education: N/A   Social History Main Topics  . Smoking status: Current Every Day Smoker  . Smokeless tobacco: Not on file  . Alcohol Use: No  . Drug Use: No  . Sexual Activity: Not on file   Other Topics Concern  . Not on file   Social History Narrative     FAMILY HISTORY:   During the visit, a 3-generation pedigree was obtained. Family tree will be sent for scanning and will be in EPIC under the Media tab.  Significant diagnoses include the following:  Family History  Problem Relation Age of Onset  . Breast cancer Mother 3852    recurrence at age 38  . Cancer Mother 1967    head/neck ca  . Diabetes Father     type 2  . Prostate cancer Father 3558    currently 4978  . Breast cancer Maternal Grandmother     Dx 5050s; deceased 7150s    Additionally, Ms. Kathleen Rubio has no children and no siblings.  Ms. Kathleen Rubio's ancestry is [place]. There is no known Jewish ancestry and no consanguinity.  ASSESSMENT AND PLAN:  Ms. Kathleen Rubio is a 38 y.o. female with a family history of breast cancer in her mother and maternal grandmother in their 450s. This history is not highly suggestive of a hereditary predisposition to cancer, but genetic testing is warranted to rule out a mutation. We reviewed the characteristics, features and inheritance patterns of hereditary cancer syndromes. We also discussed genetic testing, including the process of testing, insurance coverage and implications of results. A negative result will be very reassuring.  Ms. Kathleen Rubio wished to pursue genetic testing and a blood sample will be sent to [facility] for analysis of the 17 genes on the BreastNext gene panel. We discussed the implications of a positive, negative and/ or Variant of Uncertain Significance (VUS) result. Results should be available in approximately 4 weeks, at which point we will contact her and address implications for her as well as address genetic testing for at-risk family members, if needed.    We encouraged Ms. Kathleen Rubio to remain in contact with Cancer Genetics annually so that we can update the family history and inform her of any changes in cancer genetics and testing that may be of benefit for this family. Ms.  Kathleen Rubio's questions were answered to her satisfaction today.   Thank you for the referral and allowing us  to share in the care of your patient.   The patient was seen for a total of 35 minutes, greater than 50% of which was spent face-to-face counseling. This patient was discussed with the overseeing provider who agrees with the above.   Elmer Pickerfri Jowanna Loeffler, MS, CGC Certified Genetic Counselor phone: [phone] [email or web address]

## 2014-08-02 ENCOUNTER — Other Ambulatory Visit: Payer: Self-pay | Admitting: Family Medicine

## 2014-08-02 DIAGNOSIS — N83202 Unspecified ovarian cyst, left side: Secondary | ICD-10-CM

## 2014-08-07 ENCOUNTER — Telehealth: Payer: Self-pay | Admitting: Obstetrics and Gynecology

## 2014-08-07 NOTE — Telephone Encounter (Signed)
lmtcb to schedule internal MD referral with Dr. Edward JollySilva.

## 2014-08-08 NOTE — Telephone Encounter (Signed)
Scheduled

## 2014-08-12 ENCOUNTER — Other Ambulatory Visit: Payer: BC Managed Care – PPO

## 2014-08-23 ENCOUNTER — Ambulatory Visit (INDEPENDENT_AMBULATORY_CARE_PROVIDER_SITE_OTHER): Payer: BC Managed Care – PPO | Admitting: *Deleted

## 2014-08-23 DIAGNOSIS — Z309 Encounter for contraceptive management, unspecified: Secondary | ICD-10-CM

## 2014-08-23 MED ORDER — MEDROXYPROGESTERONE ACETATE 150 MG/ML IM SUSP
150.0000 mg | Freq: Once | INTRAMUSCULAR | Status: AC
  Administered 2014-08-23: 150 mg via INTRAMUSCULAR

## 2014-09-02 ENCOUNTER — Encounter: Payer: Self-pay | Admitting: Genetic Counselor

## 2014-09-02 DIAGNOSIS — Z1379 Encounter for other screening for genetic and chromosomal anomalies: Secondary | ICD-10-CM

## 2014-09-02 NOTE — Progress Notes (Signed)
GENETIC TEST RESULTS  Patient Name: Sevyn Markham Patient Age: 38 y.o. Encounter Date: 09/02/2014  Referring Physician: Stevie Kern, MD   Ms. Nobles was called today to discuss genetic test results. Please see the Genetics note from her visit on 08/01/14 for a detailed discussion of her personal and family history.  GENETIC TESTING: At the time of Ms. Gomez's visit, we recommended she pursue genetic testing of multiple genes on the BreastNext gene panel. This test, which included sequencing and deletion/duplication analysis of 17 genes, was performed at [facility]. Testing was normal and did not reveal a mutation in these genes. The genes tested were ATM, BARD1, BRCA1, BRCA2, BRIP1, CDH1, CHEK2, MRE11A, MUTYH, NBN, NF1, PALB2, PTEN, RAD50, RAD51C, RAD51D, and TP53. This is a reassuring result for her.  We discussed with Ms. Littler that since the current test is not perfect, it is possible there may be a gene mutation that current testing cannot detect, but that chance is small. We also discussed that it is possible that a different genetic factor, which was not part of this testing or has not yet been discovered, is responsible for the cancer diagnoses in the family. Should Ms. Kober wish to discuss or pursue this additional testing, we are happy to coordinate this at any time, but do not feel that she is at significant risk of harboring a mutation in a different gene.     CANCER SCREENING: This normal result is reassuring and indicates that Ms. Petroni does not likely have an increased risk of cancer due to a mutation in one of these genes. She understood that her risk may be increased above general population risk due to her family history. We recommended Ms. Blessinger continue to follow the cancer screening guidelines provided by her primary physician.   FAMILY MEMBERS: Women in Ms. Velazquez's maternal family are at some increased risk of developing breast cancer, over the general population risk, simply due to the  family history. We recommended they have a yearly mammogram beginning at age 54, a yearly clinical breast exam, and perform monthly breast self-exams. A gynecologic exam is recommended yearly. Colon cancer screening for men and women is recommended to begin by age 25.  We discussed that her mother may wish to also undergo genetic testing. Genetic testing is best initiated in someone who has had cancer. Please let us know if we can help facilitate testing. Genetic counselors can be located in other cities, by visiting the website of the [employer] (Artist[employer].se) and Field seismologist for a Dietitian by zip code. Ms. Cadle indicated that her mother lives in [place] and receives care at [facility], so will discuss this information with her physicians.  Lastly, we discussed with Ms. Hribar that cancer genetics is a rapidly advancing field and it is possible that new genetic tests will be appropriate for her in the future. We encouraged her to remain in contact with [place] on an annual basis so we can update her personal and family histories, and let her know of advances in cancer genetics that may benefit the family. Our contact number was provided. Ms. Franey questions were answered to her satisfaction today, and she knows she is welcome to call anytime with additional questions.    Steele Berg, MS, [place] Certified Genetic Counselor phone: [phone] [email or web address]

## 2014-09-04 ENCOUNTER — Encounter: Payer: Self-pay | Admitting: Obstetrics and Gynecology

## 2014-09-04 ENCOUNTER — Ambulatory Visit (INDEPENDENT_AMBULATORY_CARE_PROVIDER_SITE_OTHER): Payer: BC Managed Care – PPO | Admitting: Obstetrics and Gynecology

## 2014-09-04 VITALS — BP 136/92 | HR 100 | Resp 16 | Ht 64.5 in | Wt 122.2 lb

## 2014-09-04 DIAGNOSIS — N8300 Follicular cyst of ovary, unspecified side: Secondary | ICD-10-CM

## 2014-09-04 NOTE — Progress Notes (Signed)
Patient ID: Kathleen Rubio, female   DOB: 07/05/1976, 38 y.o.   MRN: [id number] GYNECOLOGY VISIT  PCP:   Referring provider:   HPI: 38 y.o.   Married  Asian  female   [id number] with No LMP recorded. Patient has had an injection.   Here for evaluation of left ovarian cyst found on ultrasound.    One year history of pressure to left of umbilicus.  Had routine pelvic exam and PCP noted a difference of her pelvic exam left versus right.   No dysuria or changes in bowel habits.  No menses for 5 - 7 years since last came off Depo Provera.  No history of painful menses.   No prior history of laparoscopy or abdominal surgeries.   Report below -   CLINICAL DATA:  Left lower quadrant discomfort.   EXAM: TRANSABDOMINAL AND TRANSVAGINAL ULTRASOUND OF PELVIS   TECHNIQUE: Both transabdominal and transvaginal ultrasound examinations of the pelvis were performed. Transabdominal technique was performed for global imaging of the pelvis including uterus, ovaries, adnexal regions, and pelvic cul-de-sac. It was necessary to proceed with endovaginal exam following the transabdominal exam to visualize the uterus and ovaries.   COMPARISON:  None   FINDINGS: Uterus   Measurements: 4.1 x 2.2 x 2.3 cm. No fibroids or other mass visualized.   Endometrium   Thickness: 2.3 mm.  No focal abnormality visualized.   Right ovary   None visualize due to overlying bowel gas.   Left ovary   Measurements: 2.2 x 1.3 x 1.9 cm. Normal appearance/no adnexal mass. 9 mm simple cyst.   Other findings   No free fluid.   IMPRESSION: No significant abnormality identified.  9 mm simple cyst left ovary.     Electronically Signed   By: Maisie Fushomas  Register   On: 08/01/2014 13:27   Hgb:   Urine:    GYNECOLOGIC HISTORY: No LMP recorded. Patient has had an injection. Sexually active:  yes Partner preference: female Contraception: Depo Provera injection   Menopausal hormone therapy: n/a DES exposure:  no Blood  transfusions: no   Sexually transmitted diseases:  no  GYN procedures and prior surgeries: none  Last mammogram:   03-08-14 heterogeneously dense/nl/[facility]              Last pap and high risk HPV testing:  07-25-14 wnl:neg HR HPV  History of abnormal pap smear: no    OB History    Gravida Para Term Preterm AB TAB SAB Ectopic Multiple Living   0 0 0 0 0 0 0 0 0 0        LIFESTYLE: Exercise:  walking             Tobacco:   Smokes 1pack every 2-4 days Alcohol:   3-4 drinks per week Drug use:  no  Patient Active Problem List   Diagnosis Date Noted  . Genetic testing 09/02/2014  . Family history of breast cancer   . Family history of breast cancer in first degree relative 07/24/2013  . Insomnia 04/05/2012  . Contraception management 05/31/2011  . DYSHIDROSIS 05/13/2009  . ABNORMAL GLANDULAR PAPANICOLAOU SMEAR OF VAGINA 05/13/2009  . Allergic rhinitis 11/19/2008  . ACNE, CYSTIC 11/19/2008    Past Medical History  Diagnosis Date  . Allergy     allergic rhinitis  . Cystic acne   . Dysfunctional uterine bleeding   . Family history of breast cancer   . Ovarian cyst, left   . Anxiety   . Depression   .  Heart murmur     followed by Dr. Sheliah Mends work-up    Past Surgical History  Procedure Laterality Date  . Wisdom tooth extraction      Current Outpatient Prescriptions  Medication Sig Dispense Refill  . EPINEPHrine 0.3 mg/0.3 mL IJ SOAJ injection Inject 0.3 mLs (0.3 mg total) into the muscle once. 2 Device 1  . LORazepam (ATIVAN) 0.5 MG tablet 1 by mouth each bedtime when necessary for sleep 100 tablet 3  . varenicline (CHANTIX CONTINUING MONTH PAK) 1 MG tablet TAKE 1/2 TABLET BY MOUTH EVERY DAY 60 tablet 3  . zolpidem (AMBIEN) 5 MG tablet Half tab at bedtime when needed 60 tablet 5   No current facility-administered medications for this visit.     ALLERGIES: Review of patient's allergies indicates no known allergies.  Family History  Problem Relation Age  of Onset  . Breast cancer Mother 3    recurrence at age 50  . Cancer Mother 38    head/neck ca  . Diabetes Father     type 2  . Prostate cancer Father 34    currently 19  . Hyperlipidemia Father   . Breast cancer Maternal Grandmother     Dx 14s; deceased 58s    History   Social History  . Marital Status: Married    Spouse Name: N/A    Number of Children: N/A  . Years of Education: N/A   Occupational History  . Not on file.   Social History Main Topics  . Smoking status: Current Every Day Smoker  . Smokeless tobacco: Not on file     Comment: smokes 1pack every 2-4 days  . Alcohol Use: 2.4 oz/week    4 Not specified per week  . Drug Use: No  . Sexual Activity:    Partners: Male    Birth Control/ Protection: Injection     Comment: Depo Provera   Other Topics Concern  . Not on file   Social History Narrative    ROS:  Pertinent items are noted in HPI.  PHYSICAL EXAMINATION:    BP 136/92 mmHg  Pulse 100  Resp 16  Ht 5' 4.5" (1.638 m)  Wt 122 lb 3.2 oz (55.43 kg)  BMI 20.66 kg/m2   Wt Readings from Last 3 Encounters:  09/04/14 122 lb 3.2 oz (55.43 kg)  07/25/14 121 lb (54.885 kg)  05/04/12 120 lb (54.432 kg)     Ht Readings from Last 3 Encounters:  09/04/14 5' 4.5" (1.638 m)  07/25/14 5\' 5"  (1.651 m)  05/04/12 5\' 5"  (1.651 m)    General appearance: alert, cooperative and appears stated age Head: Normocephalic, without obvious abnormality, atraumatic Lungs: clear to auscultation bilaterally Heart: regular rate and rhythm Abdomen: soft, non-tender; no masses,  no organomegaly Extremities: extremities normal, atraumatic, no cyanosis or edema Skin: Skin color, texture, turgor normal. No rashes or lesions Lymph nodes: Cervical, supraclavicular, and inguinal nodes negative.  No abnormal inguinal nodes palpated Neurologic: Grossly normal  Pelvic: External genitalia:  no lesions              Urethra:  normal appearing urethra with no masses, tenderness or  lesions              Bartholins and Skenes: normal                 Vagina: normal appearing vagina with normal color and discharge, no lesions              Cervix:  normal appearance                 Bimanual Exam:  Uterus:  uterus is normal size, shape, consistency and nontender.  Uterus deviates more to the left than the right.                                       Adnexa: normal adnexa in size, nontender and no masses                                      Rectovaginal: Confirms                                      Anus:  normal sphincter tone, no lesions  ASSESSMENT  Simple left ovarian cyst - follicular cyst.  Left sided discomfort of unclear etiology.  I do not believe the follicular cyst is responsible for this.   PLAN  Discussion of ovarian cysts in verbal and written form. Images and report of ultrasound reviewed with patient and friend who was present for the discussion portion of the visit.  Questions answered. Return to PCP for further evaluation of work up of pain.  If pain persists and work up is negative, consider laparoscopic evaluation.   An After Visit Summary was printed and given to the patient.  30 minutes face to face time of which over 50% was spent in counseling.

## 2014-09-04 NOTE — Patient Instructions (Signed)
Ovarian Cyst An ovarian cyst is a fluid-filled sac that forms on an ovary. The ovaries are small organs that produce eggs in women. Various types of cysts can form on the ovaries. Most are not cancerous. Many do not cause problems, and they often go away on their own. Some may cause symptoms and require treatment. Common types of ovarian cysts include:  Functional cysts--These cysts may occur every month during the menstrual cycle. This is normal. The cysts usually go away with the next menstrual cycle if the woman does not get pregnant. Usually, there are no symptoms with a functional cyst.  Endometrioma cysts--These cysts form from the tissue that lines the uterus. They are also called "chocolate cysts" because they become filled with blood that turns brown. This type of cyst can cause pain in the lower abdomen during intercourse and with your menstrual period.  Cystadenoma cysts--This type develops from the cells on the outside of the ovary. These cysts can get very big and cause lower abdomen pain and pain with intercourse. This type of cyst can twist on itself, cut off its blood supply, and cause severe pain. It can also easily rupture and cause a lot of pain.  Dermoid cysts--This type of cyst is sometimes found in both ovaries. These cysts may contain different kinds of body tissue, such as skin, teeth, hair, or cartilage. They usually do not cause symptoms unless they get very big.  Theca lutein cysts--These cysts occur when too much of a certain hormone (human chorionic gonadotropin) is produced and overstimulates the ovaries to produce an egg. This is most common after procedures used to assist with the conception of a baby (in vitro fertilization). CAUSES   Fertility drugs can cause a condition in which multiple large cysts are formed on the ovaries. This is called ovarian hyperstimulation syndrome.  A condition called polycystic ovary syndrome can cause hormonal imbalances that can lead to  nonfunctional ovarian cysts. SIGNS AND SYMPTOMS  Many ovarian cysts do not cause symptoms. If symptoms are present, they may include:  Pelvic pain or pressure.  Pain in the lower abdomen.  Pain during sexual intercourse.  Increasing girth (swelling) of the abdomen.  Abnormal menstrual periods.  Increasing pain with menstrual periods.  Stopping having menstrual periods without being pregnant. DIAGNOSIS  These cysts are commonly found during a routine or annual pelvic exam. Tests may be ordered to find out more about the cyst. These tests may include:  Ultrasound.  X-ray of the pelvis.  CT scan.  MRI.  Blood tests. TREATMENT  Many ovarian cysts go away on their own without treatment. Your health care provider may want to check your cyst regularly for 2-3 months to see if it changes. For women in menopause, it is particularly important to monitor a cyst closely because of the higher rate of ovarian cancer in menopausal women. When treatment is needed, it may include any of the following:  A procedure to drain the cyst (aspiration). This may be done using a long needle and ultrasound. It can also be done through a laparoscopic procedure. This involves using a thin, lighted tube with a tiny camera on the end (laparoscope) inserted through a small incision.  Surgery to remove the whole cyst. This may be done using laparoscopic surgery or an open surgery involving a larger incision in the lower abdomen.  Hormone treatment or birth control pills. These methods are sometimes used to help dissolve a cyst. HOME CARE INSTRUCTIONS   Only take over-the-counter   or prescription medicines as directed by your health care provider.  Follow up with your health care provider as directed.  Get regular pelvic exams and Pap tests. SEEK MEDICAL CARE IF:   Your periods are late, irregular, or painful, or they stop.  Your pelvic pain or abdominal pain does not go away.  Your abdomen becomes  larger or swollen.  You have pressure on your bladder or trouble emptying your bladder completely.  You have pain during sexual intercourse.  You have feelings of fullness, pressure, or discomfort in your stomach.  You lose weight for no apparent reason.  You feel generally ill.  You become constipated.  You lose your appetite.  You develop acne.  You have an increase in body and facial hair.  You are gaining weight, without changing your exercise and eating habits.  You think you are pregnant. SEEK IMMEDIATE MEDICAL CARE IF:   You have increasing abdominal pain.  You feel sick to your stomach (nauseous), and you throw up (vomit).  You develop a fever that comes on suddenly.  You have abdominal pain during a bowel movement.  Your menstrual periods become heavier than usual. MAKE SURE YOU:  Understand these instructions.  Will watch your condition.  Will get help right away if you are not doing well or get worse. Document Released: 09/06/2005 Document Revised: 09/11/2013 Document Reviewed: 05/14/2013 Exit[employer] Patient Information 2015 [employer]. This information is not intended to replace advice given to you by your health care provider. Make sure you discuss any questions you have with your health care provider.  

## 2014-09-25 ENCOUNTER — Telehealth: Payer: Self-pay | Admitting: Family Medicine

## 2014-09-25 DIAGNOSIS — R1032 Left lower quadrant pain: Secondary | ICD-10-CM

## 2014-09-25 NOTE — Telephone Encounter (Signed)
[facility] Dr. Mady GemmaSidel regarding recommendation for CT scan and colonoscopy.

## 2014-09-25 NOTE — Telephone Encounter (Signed)
Patient went to see Dr Mady GemmaSidel GYN for the cyst on her left side.  She was advised that the cyst was not causing her pain and that she should go to GI.  Okay to refer?

## 2014-09-26 NOTE — Telephone Encounter (Signed)
Referral okay per Dr Tawanna Coolerodd

## 2014-10-17 ENCOUNTER — Other Ambulatory Visit: Payer: Self-pay | Admitting: Family Medicine

## 2014-10-25 ENCOUNTER — Other Ambulatory Visit: Payer: BC Managed Care – PPO

## 2014-10-25 ENCOUNTER — Other Ambulatory Visit: Payer: Self-pay | Admitting: Family Medicine

## 2014-10-30 ENCOUNTER — Other Ambulatory Visit: Payer: Self-pay | Admitting: Family Medicine

## 2014-11-12 ENCOUNTER — Ambulatory Visit (INDEPENDENT_AMBULATORY_CARE_PROVIDER_SITE_OTHER): Payer: BLUE CROSS/BLUE SHIELD | Admitting: *Deleted

## 2014-11-12 DIAGNOSIS — N938 Other specified abnormal uterine and vaginal bleeding: Secondary | ICD-10-CM

## 2014-11-12 MED ORDER — MEDROXYPROGESTERONE ACETATE 150 MG/ML IM SUSP
150.0000 mg | Freq: Once | INTRAMUSCULAR | Status: AC
  Administered 2014-11-12: 150 mg via INTRAMUSCULAR

## 2014-11-15 ENCOUNTER — Other Ambulatory Visit: Payer: BLUE CROSS/BLUE SHIELD

## 2014-11-19 ENCOUNTER — Encounter: Payer: Self-pay | Admitting: Gastroenterology

## 2015-01-06 ENCOUNTER — Ambulatory Visit: Payer: BLUE CROSS/BLUE SHIELD | Admitting: Gastroenterology

## 2015-02-15 ENCOUNTER — Other Ambulatory Visit: Payer: Self-pay | Admitting: Family Medicine

## 2015-03-06 ENCOUNTER — Telehealth: Payer: Self-pay | Admitting: Family Medicine

## 2015-03-06 MED ORDER — SULFAMETHOXAZOLE-TRIMETHOPRIM 800-160 MG PO TABS: 1.0000 | ORAL_TABLET | Freq: Two times a day (BID) | ORAL | Status: DC

## 2015-03-06 NOTE — Telephone Encounter (Signed)
Pt thinks she has a UTI and is in [place] today.  Pt wants to know if you can call in rx for that? Pt would like you to call her concerning this.

## 2015-03-06 NOTE — Telephone Encounter (Signed)
Left message on machine for patient to return our call for which pharmacy to send prescription to. [facility], 1 twice daily, 20 with 1 refill per Dr Tawanna Cooler

## 2015-03-07 ENCOUNTER — Ambulatory Visit (INDEPENDENT_AMBULATORY_CARE_PROVIDER_SITE_OTHER): Payer: Managed Care, Other (non HMO) | Admitting: *Deleted

## 2015-03-07 DIAGNOSIS — N938 Other specified abnormal uterine and vaginal bleeding: Secondary | ICD-10-CM

## 2015-03-07 DIAGNOSIS — Z309 Encounter for contraceptive management, unspecified: Secondary | ICD-10-CM

## 2015-03-07 MED ORDER — MEDROXYPROGESTERONE ACETATE 150 MG/ML IM SUSP
150.0000 mg | Freq: Once | INTRAMUSCULAR | Status: AC
  Administered 2015-03-07: 150 mg via INTRAMUSCULAR

## 2015-05-10 ENCOUNTER — Other Ambulatory Visit: Payer: Self-pay | Admitting: Family Medicine

## 2015-06-20 ENCOUNTER — Ambulatory Visit (INDEPENDENT_AMBULATORY_CARE_PROVIDER_SITE_OTHER): Payer: Managed Care, Other (non HMO) | Admitting: *Deleted

## 2015-06-20 DIAGNOSIS — Z309 Encounter for contraceptive management, unspecified: Secondary | ICD-10-CM | POA: Diagnosis not present

## 2015-06-20 DIAGNOSIS — N938 Other specified abnormal uterine and vaginal bleeding: Secondary | ICD-10-CM

## 2015-06-20 DIAGNOSIS — Z23 Encounter for immunization: Secondary | ICD-10-CM

## 2015-06-20 MED ORDER — MEDROXYPROGESTERONE ACETATE 150 MG/ML IM SUSP
150.0000 mg | Freq: Once | INTRAMUSCULAR | Status: AC
  Administered 2015-06-20: 150 mg via INTRAMUSCULAR

## 2015-06-20 NOTE — Progress Notes (Signed)
   Subjective:    Patient ID: Kathleen Rubio, female    DOB: [birth year], 39 y.o.   MRN: [id number]  HPI    Review of Systems     Objective:   Physical Exam        Assessment & Plan:

## 2015-08-28 ENCOUNTER — Ambulatory Visit (INDEPENDENT_AMBULATORY_CARE_PROVIDER_SITE_OTHER): Payer: Managed Care, Other (non HMO) | Admitting: Family Medicine

## 2015-08-28 ENCOUNTER — Encounter: Payer: Self-pay | Admitting: Family Medicine

## 2015-08-28 VITALS — BP 122/90 | HR 103 | Temp 98.1°F | Ht 64.5 in | Wt 117.1 lb

## 2015-08-28 DIAGNOSIS — J069 Acute upper respiratory infection, unspecified: Secondary | ICD-10-CM | POA: Diagnosis not present

## 2015-08-28 MED ORDER — DOXYCYCLINE HYCLATE 100 MG PO TABS: 100.0000 mg | ORAL_TABLET | Freq: Two times a day (BID) | ORAL | Status: DC

## 2015-08-28 MED ORDER — PREDNISONE 10 MG PO TABS: ORAL_TABLET | ORAL | Status: DC

## 2015-08-28 NOTE — Patient Instructions (Signed)
Take the medications as prescribed  Follow up if symptoms worsen or persist or other concerns

## 2015-08-28 NOTE — Progress Notes (Signed)
HPI:  Sinusitis/Cough: -started: 3-4 weeks ago, worsening the last several days -symptoms:nasal congestion, sore throat, cough, thick mucus -denies:fever, SOB, NVD, tooth pain -has tried: nothing -sick contacts/travel/risks: denies flu exposure, tick exposure or or Ebola risks -Hx of: allergies ROS: See pertinent positives and negatives per HPI.  Past Medical History  Diagnosis Date  . Allergy     allergic rhinitis  . Cystic acne   . Dysfunctional uterine bleeding   . Family history of breast cancer   . Ovarian cyst, left   . Anxiety   . Depression   . Heart murmur     followed by Dr. Sheliah Mends work-up    Past Surgical History  Procedure Laterality Date  . Wisdom tooth extraction      Family History  Problem Relation Age of Onset  . Breast cancer Mother 15    recurrence at age 88  . Cancer Mother 56    head/neck ca  . Diabetes Father     type 2  . Prostate cancer Father 65    currently 76  . Hyperlipidemia Father   . Breast cancer Maternal Grandmother     Dx 13s; deceased 72s    Social History   Social History  . Marital Status: Married    Spouse Name: N/A  . Number of Children: N/A  . Years of Education: N/A   Social History Main Topics  . Smoking status: Current Every Day Smoker  . Smokeless tobacco: None     Comment: smokes 1pack every 2-4 days  . Alcohol Use: 2.4 oz/week    4 Standard drinks or equivalent per week  . Drug Use: No  . Sexual Activity:    Partners: Male    Birth Control/ Protection: Injection     Comment: Depo Provera   Other Topics Concern  . None   Social History Narrative     Current outpatient prescriptions:  .  amitriptyline (ELAVIL) 25 MG tablet, TAKE 1 TABLET BY MOUTH AT BEDTIME, Disp: 100 tablet, Rfl: 3 .  CHANTIX 1 MG tablet, TAKE 1/2 TABLET BY MOUTH EVERY DAY, Disp: 60 tablet, Rfl: 2 .  EPINEPHrine 0.3 mg/0.3 mL IJ SOAJ injection, Inject 0.3 mLs (0.3 mg total) into the muscle once., Disp: 2 Device, Rfl: 1 .   LORazepam (ATIVAN) 0.5 MG tablet, 1 by mouth each bedtime when necessary for sleep, Disp: 100 tablet, Rfl: 3 .  valACYclovir (VALTREX) 1000 MG tablet, TAKE 1 TABLET (1,000 MG TOTAL) BY MOUTH 2 (TWO) TIMES DAILY., Disp: 90 tablet, Rfl: 1 .  zolpidem (AMBIEN) 5 MG tablet, TAKE 1/2 TABLET AT BEDTIME WHEN NEEDED, Disp: 60 tablet, Rfl: 3 .  doxycycline (VIBRA-TABS) 100 MG tablet, Take 1 tablet (100 mg total) by mouth 2 (two) times daily., Disp: 14 tablet, Rfl: 0 .  predniSONE (DELTASONE) 10 MG tablet,  (3tabs) daily for 2 days, then (2 tabs) daily x2 days, then  (1 tab) daily for 2 days, Disp: 12 tablet, Rfl: 0  EXAM:  Filed Vitals:   08/28/15 1355  BP: 122/90  Pulse: 103  Temp: 98.1 F (36.7 C)    Body mass index is 19.8 kg/(m^2).  GENERAL: vitals reviewed and listed above, alert, oriented, appears well hydrated and in no acute distress  HEENT: atraumatic, conjunttiva clear, no obvious abnormalities on inspection of external nose and ears, normal appearance of ear canals and TMs, clear nasal congestion, mild post oropharyngeal erythema with PND, no tonsillar edema or exudate, no sinus TTP  NECK: no  obvious masses on inspection  LUNGS: clear to auscultation bilaterally, no wheezes, rales or rhonchi, good air movement  CV: HRRR, no peripheral edema  MS: moves all extremities without noticeable abnormality  PSYCH: pleasant and cooperative, no obvious depression or anxiety  ASSESSMENT AND PLAN:  Discussed the following assessment and plan:  Acute upper respiratory infection  -given prolonged symptom and worsening suspect possible bacterial resp inf (sinustis or other) - discussed workup, treatment and risks -she opted for abx and steroid and follow up if symptoms persist or worsen -of course, we advised to return or notify a doctor immediately if symptoms worsen or persist or new concerns arise.    Patient Instructions  Take the medications as prescribed  Follow up  if symptoms worsen or persist or other concerns     KIM, HANNAH R.

## 2015-08-28 NOTE — Progress Notes (Signed)
Pre visit review using our clinic review tool, if applicable. No additional management support is needed unless otherwise documented below in the visit note. 

## 2015-09-10 ENCOUNTER — Ambulatory Visit (INDEPENDENT_AMBULATORY_CARE_PROVIDER_SITE_OTHER): Payer: Managed Care, Other (non HMO) | Admitting: Adult Health

## 2015-09-10 ENCOUNTER — Encounter: Payer: Self-pay | Admitting: Adult Health

## 2015-09-10 VITALS — BP 124/84 | HR 108 | Temp 98.4°F | Ht 64.5 in | Wt 116.3 lb

## 2015-09-10 DIAGNOSIS — J069 Acute upper respiratory infection, unspecified: Secondary | ICD-10-CM | POA: Diagnosis not present

## 2015-09-10 DIAGNOSIS — Z309 Encounter for contraceptive management, unspecified: Secondary | ICD-10-CM | POA: Diagnosis not present

## 2015-09-10 MED ORDER — METHYLPREDNISOLONE 4 MG PO TBPK: ORAL_TABLET | ORAL | Status: DC

## 2015-09-10 MED ORDER — MEDROXYPROGESTERONE ACETATE 150 MG/ML IM SUSP
150.0000 mg | Freq: Once | INTRAMUSCULAR | Status: AC
  Administered 2015-09-10: 150 mg via INTRAMUSCULAR

## 2015-09-10 NOTE — Progress Notes (Signed)
Pre visit review using our clinic review tool, if applicable. No additional management support is needed unless otherwise documented below in the visit note. 

## 2015-09-10 NOTE — Progress Notes (Signed)
Subjective:    Patient ID: Kathleen Rubio, female    DOB: 02/09/1976, 39 y.o.   MRN: [id number]  HPI   39 year old pleasant female who presents to the office today for continued upper respiratory infection. She had seen Dr. Selena BattenKim on 08/28/2015 and was prescribed a course of Doxycycline and Prednisone. Her symptoms had resolved until late last week when the maxillary sinus pain and pressure returned as well as a productive cough. She endorses that her symptoms are not as bad as they were two weeks ago. S  She denies fever, n/v/d or feeling acutely ill.     Review of Systems  Constitutional: Negative.   HENT: Positive for congestion, postnasal drip and sinus pressure. Negative for ear discharge, ear pain, rhinorrhea, sore throat and trouble swallowing.   Eyes: Negative.   Respiratory: Positive for cough. Negative for chest tightness, shortness of breath and wheezing.   Cardiovascular: Negative.   Neurological: Negative.   Psychiatric/Behavioral: Negative.   All other systems reviewed and are negative.  Past Medical History  Diagnosis Date  . Allergy     allergic rhinitis  . Cystic acne   . Dysfunctional uterine bleeding   . Family history of breast cancer   . Ovarian cyst, left   . Anxiety   . Depression   . Heart murmur     followed by Dr. Sheliah Mendsodd--normal work-up    Social History   Social History  . Marital Status: Married    Spouse Name: N/A  . Number of Children: N/A  . Years of Education: N/A   Occupational History  . Not on file.   Social History Main Topics  . Smoking status: Current Every Day Smoker  . Smokeless tobacco: Not on file     Comment: smokes 1pack every 2-4 days  . Alcohol Use: 2.4 oz/week    4 Standard drinks or equivalent per week  . Drug Use: No  . Sexual Activity:    Partners: Male    Birth Control/ Protection: Injection     Comment: Depo Provera   Other Topics Concern  . Not on file   Social History Narrative    Past Surgical History    Procedure Laterality Date  . Wisdom tooth extraction      Family History  Problem Relation Age of Onset  . Breast cancer Mother 1552    recurrence at age 39  . Cancer Mother 7967    head/neck ca  . Diabetes Father     type 2  . Prostate cancer Father 1758    currently 4278  . Hyperlipidemia Father   . Breast cancer Maternal Grandmother     Dx 3750s; deceased 7450s    No Known Allergies  Current Outpatient Prescriptions on File Prior to Visit  Medication Sig Dispense Refill  . amitriptyline (ELAVIL) 25 MG tablet TAKE 1 TABLET BY MOUTH AT BEDTIME 100 tablet 3  . CHANTIX 1 MG tablet TAKE 1/2 TABLET BY MOUTH EVERY DAY 60 tablet 2  . EPINEPHrine 0.3 mg/0.3 mL IJ SOAJ injection Inject 0.3 mLs (0.3 mg total) into the muscle once. 2 Device 1  . LORazepam (ATIVAN) 0.5 MG tablet 1 by mouth each bedtime when necessary for sleep 100 tablet 3  . valACYclovir (VALTREX) 1000 MG tablet TAKE 1 TABLET (1,000 MG TOTAL) BY MOUTH 2 (TWO) TIMES DAILY. 90 tablet 1  . zolpidem (AMBIEN) 5 MG tablet TAKE 1/2 TABLET AT BEDTIME WHEN NEEDED 60 tablet 3   No current  facility-administered medications on file prior to visit.    BP 124/84 mmHg  Pulse 108  Temp(Src) 98.4 F (36.9 C) (Oral)  Ht 5' 4.5" (1.638 m)  Wt 116 lb 4.8 oz (52.753 kg)  BMI 19.66 kg/m2  SpO2 98%       Objective:   Physical Exam  Constitutional: She is oriented to person, place, and time. She appears well-developed and well-nourished. No distress.  HENT:  Head: Normocephalic and atraumatic.  Right Ear: External ear normal.  Left Ear: External ear normal.  Nose: Nose normal.  Mouth/Throat: Oropharynx is clear and moist. No oropharyngeal exudate.  TM visualized. No signs of infection  Eyes: Conjunctivae and EOM are normal. Pupils are equal, round, and reactive to light. Right eye exhibits no discharge. Left eye exhibits no discharge. No scleral icterus.  Neck: Normal range of motion. Neck supple. No JVD present. No tracheal deviation  present. No thyromegaly present.  Cardiovascular: Normal rate, normal heart sounds and intact distal pulses.  Exam reveals no gallop and no friction rub.   No murmur heard. Pulmonary/Chest: Effort normal and breath sounds normal. No respiratory distress. She has no wheezes. She has no rales. She exhibits no tenderness.  Musculoskeletal: Normal range of motion. She exhibits no edema or tenderness.  Neurological: She is alert and oriented to person, place, and time.  Skin: Skin is warm and dry. No rash noted. She is not diaphoretic. No erythema. No pallor.  Psychiatric: She has a normal mood and affect. Her behavior is normal. Judgment and thought content normal.  Nursing note and vitals reviewed.     Assessment & Plan:  1. Acute upper respiratory infection - Discussed treatment options such as chest xray and blood work. She does not have a fever, lungs are clear, I have little suspicion for pneumonia. She has recently finished a course of doxycycline.   She will opt to go for the additional dose of prednisone.  - methylPREDNISolone (MEDROL DOSEPAK) 4 MG TBPK tablet; Take as directed  Dispense: 21 tablet; Refill: 0 - Follow up if no improvement.   2. Encounter for contraceptive management, unspecified encounter - medroxyPROGESTERone (DEPO-PROVERA) injection 150 mg; Inject 1 mL (150 mg total) into the muscle once.

## 2015-10-13 ENCOUNTER — Encounter: Payer: Self-pay | Admitting: Family Medicine

## 2015-10-13 ENCOUNTER — Emergency Department (HOSPITAL_COMMUNITY): Payer: Self-pay

## 2015-10-13 ENCOUNTER — Inpatient Hospital Stay (HOSPITAL_COMMUNITY)
Admission: EM | Admit: 2015-10-13 | Discharge: 2015-10-13 | DRG: 440 | Disposition: A | Discharge status: Home / Self Care | Payer: Self-pay | Attending: Internal Medicine | Admitting: Internal Medicine

## 2015-10-13 ENCOUNTER — Ambulatory Visit (INDEPENDENT_AMBULATORY_CARE_PROVIDER_SITE_OTHER): Payer: Self-pay | Admitting: Family Medicine

## 2015-10-13 ENCOUNTER — Encounter (HOSPITAL_COMMUNITY): Payer: Self-pay | Admitting: Emergency Medicine

## 2015-10-13 VITALS — BP 110/70 | Temp 98.8°F | Wt 118.0 lb

## 2015-10-13 DIAGNOSIS — F17210 Nicotine dependence, cigarettes, uncomplicated: Secondary | ICD-10-CM | POA: Diagnosis present

## 2015-10-13 DIAGNOSIS — K859 Acute pancreatitis without necrosis or infection, unspecified: Principal | ICD-10-CM | POA: Diagnosis present

## 2015-10-13 DIAGNOSIS — R000 Tachycardia, unspecified: Secondary | ICD-10-CM | POA: Diagnosis present

## 2015-10-13 DIAGNOSIS — D72829 Elevated white blood cell count, unspecified: Secondary | ICD-10-CM | POA: Diagnosis present

## 2015-10-13 DIAGNOSIS — R109 Unspecified abdominal pain: Secondary | ICD-10-CM | POA: Diagnosis present

## 2015-10-13 DIAGNOSIS — Z8719 Personal history of other diseases of the digestive system: Secondary | ICD-10-CM | POA: Diagnosis present

## 2015-10-13 DIAGNOSIS — Z79899 Other long term (current) drug therapy: Secondary | ICD-10-CM

## 2015-10-13 DIAGNOSIS — R1010 Upper abdominal pain, unspecified: Secondary | ICD-10-CM

## 2015-10-13 DIAGNOSIS — E781 Pure hyperglyceridemia: Secondary | ICD-10-CM | POA: Diagnosis present

## 2015-10-13 DIAGNOSIS — R1011 Right upper quadrant pain: Secondary | ICD-10-CM

## 2015-10-13 LAB — RAPID URINE DRUG SCREEN, HOSP PERFORMED
Amphetamines: NOT DETECTED
Barbiturates: NOT DETECTED
Benzodiazepines: NOT DETECTED
Cocaine: NOT DETECTED
Opiates: NOT DETECTED
Tetrahydrocannabinol: NOT DETECTED

## 2015-10-13 LAB — URINE MICROSCOPIC-ADD ON

## 2015-10-13 LAB — LIPID PANEL
Cholesterol: 490 mg/dL — ABNORMAL HIGH (ref 0–200)
HDL: 47 mg/dL (ref >40)
LDL Cholesterol: UNDETERMINED mg/dL (ref 0–99)
Total CHOL/HDL Ratio: 10.4 RATIO
Triglycerides: 899 mg/dL — ABNORMAL HIGH (ref <150)
VLDL: UNDETERMINED mg/dL (ref 0–40)

## 2015-10-13 LAB — COMPREHENSIVE METABOLIC PANEL
ALT: 24 U/L (ref 14–54)
AST: 39 U/L (ref 15–41)
Albumin: 3.4 g/dL — ABNORMAL LOW (ref 3.5–5.0)
Alkaline Phosphatase: 113 U/L (ref 38–126)
Anion gap: 17 — ABNORMAL HIGH (ref 5–15)
BUN: 6 mg/dL (ref 6–20)
CO2: 17 mmol/L — ABNORMAL LOW (ref 22–32)
Calcium: 8.9 mg/dL (ref 8.9–10.3)
Chloride: 100 mmol/L — ABNORMAL LOW (ref 101–111)
Creatinine, Ser: 0.64 mg/dL (ref 0.44–1.00)
GFR calc Af Amer: >60 mL/min (ref >60)
GFR calc non Af Amer: >60 mL/min (ref >60)
Glucose, Bld: 105 mg/dL — ABNORMAL HIGH (ref 65–99)
Potassium: 4 mmol/L (ref 3.5–5.1)
Sodium: 134 mmol/L — ABNORMAL LOW (ref 135–145)
Total Bilirubin: 0.9 mg/dL (ref 0.3–1.2)
Total Protein: 7.1 g/dL (ref 6.5–8.1)

## 2015-10-13 LAB — CBC
HCT: 35.3 % — ABNORMAL LOW (ref 36.0–46.0)
Hemoglobin: 12.4 g/dL (ref 12.0–15.0)
MCH: 37 pg — ABNORMAL HIGH (ref 26.0–34.0)
MCHC: 35.1 g/dL (ref 30.0–36.0)
MCV: 105.4 fL — ABNORMAL HIGH (ref 78.0–100.0)
Platelets: 260 10*3/uL (ref 150–400)
RBC: 3.35 MIL/uL — ABNORMAL LOW (ref 3.87–5.11)
RDW: 12.9 % (ref 11.5–15.5)
WBC: 11.9 10*3/uL — ABNORMAL HIGH (ref 4.0–10.5)

## 2015-10-13 LAB — URINALYSIS, ROUTINE W REFLEX MICROSCOPIC
Bilirubin Urine: NEGATIVE
Glucose, UA: NEGATIVE mg/dL
Ketones, ur: NEGATIVE mg/dL
Leukocytes, UA: NEGATIVE
Nitrite: NEGATIVE
Protein, ur: NEGATIVE mg/dL
Specific Gravity, Urine: 1.01 (ref 1.005–1.030)
pH: 5 (ref 5.0–8.0)

## 2015-10-13 LAB — LIPASE, BLOOD: Lipase: 40 U/L (ref 11–51)

## 2015-10-13 LAB — I-STAT BETA HCG BLOOD, ED (MC, WL, AP ONLY): I-stat hCG, quantitative: <5 m[IU]/mL (ref <5)

## 2015-10-13 MED ORDER — SODIUM CHLORIDE 0.9 % IV BOLUS (SEPSIS)
500.0000 mL | Freq: Once | INTRAVENOUS | Status: AC
  Administered 2015-10-13: 500 mL via INTRAVENOUS

## 2015-10-13 MED ORDER — IOHEXOL 300 MG/ML  SOLN
50.0000 mL | Freq: Once | INTRAMUSCULAR | Status: DC | PRN
  Administered 2015-10-13: 50 mL via ORAL
  Filled 2015-10-13: qty 50

## 2015-10-13 MED ORDER — IOHEXOL 300 MG/ML  SOLN
100.0000 mL | Freq: Once | INTRAMUSCULAR | Status: AC | PRN
  Administered 2015-10-13: 80 mL via INTRAVENOUS

## 2015-10-13 MED ORDER — ONDANSETRON 8 MG PO TBDP: 8.0000 mg | ORAL_TABLET | Freq: Three times a day (TID) | ORAL | Status: DC | PRN

## 2015-10-13 MED ORDER — OXYCODONE-ACETAMINOPHEN 5-325 MG PO TABS: 1.0000 | ORAL_TABLET | Freq: Four times a day (QID) | ORAL | Status: DC | PRN

## 2015-10-13 NOTE — ED Notes (Signed)
Patient transported to CT 

## 2015-10-13 NOTE — ED Notes (Signed)
Pt c/o abdominal pain and fever onset Saturday. Pain began as diffuse epigastric pain, has since localized to point over RLQ radiating to back. Positive rebound tenderness, Psoas muscle tenderness.

## 2015-10-13 NOTE — Discharge Instructions (Signed)

## 2015-10-13 NOTE — Patient Instructions (Signed)
Go directly to [place] emergency room 

## 2015-10-13 NOTE — ED Provider Notes (Signed)
[facility] CSN: [id number]     Arrival date & time 10/13/15  1028 History   First MD Initiated Contact with Patient 10/13/15 1042     Chief Complaint  Patient presents with  . Abdominal Pain      Patient is a 40 y.o. female presenting with abdominal pain. The history is provided by the patient.  Abdominal Pain Associated symptoms: fatigue and nausea   Associated symptoms: no diarrhea and no vomiting    patient was sent by her primary care doctor for abdominal pain. Has had some upper abdominal pain/umbilical pain that then localized more to the lower abdomen. She states she still hurts in the upper abdomen. She's had decreased appetite. She's had nausea without vomiting. She's had fevers up to 101 at home. No diarrhea. The pain is dull and constant. Is worse with movements. She states walking in here was painful. She states that her primary care doctor told her she does not have a cyst. She had a previous ovarian cyst however it was on the left side. Patient states she felt associated constipation, however she took a laxity of an had good results but no improvement of the pain. She has not eaten today.  History reviewed. No pertinent past medical history. Past Surgical History  Procedure Laterality Date  . Wisdom tooth extraction     Family History  Problem Relation Age of Onset  . Breast cancer Mother 84    recurrence at age 5  . Cancer Mother 27    head/neck ca  . Diabetes Father     type 2  . Prostate cancer Father 35    currently 21  . Hyperlipidemia Father   . Breast cancer Maternal Grandmother     Dx 15s; deceased 81s   Social History  Substance Use Topics  . Smoking status: Current Every Day Smoker  . Smokeless tobacco: None     Comment: smokes 1pack every 2-4 days  . Alcohol Use: 2.4 oz/week    4 Standard drinks or equivalent per week   OB History    Gravida Para Term Preterm AB TAB SAB Ectopic Multiple Living       Review of Systems   Constitutional: Positive for appetite change and fatigue.  Gastrointestinal: Positive for nausea and abdominal pain. Negative for vomiting and diarrhea.      Allergies  Review of patient's allergies indicates no known allergies.  Home Medications   Prior to Admission medications   Medication Sig Start Date End Date Taking? Authorizing Provider  amitriptyline (ELAVIL) 25 MG tablet TAKE 1 TABLET BY MOUTH AT BEDTIME 10/17/14  Yes Roderick Pee, MD  aspirin-acetaminophen-caffeine [facility] MIGRAINE) [phone] MG tablet Take 2 tablets by mouth every 6 (six) hours as needed for headache.   Yes Historical Provider, MD  bisacodyl (DULCOLAX) 10 MG suppository Place 10 mg rectally as needed for mild constipation or moderate constipation.   Yes Historical Provider, MD  CHANTIX 1 MG tablet TAKE 1/2 TABLET BY MOUTH EVERY DAY Patient taking differently: TAKE 1/2 TABLET TWICE DAILY BY MOUTH 10/30/14  Yes Roderick Pee, MD  EPINEPHrine 0.3 mg/0.3 mL IJ SOAJ injection Inject 0.3 mLs (0.3 mg total) into the muscle once. 07/25/14  Yes Roderick Pee, MD  magnesium hydroxide (MILK OF MAGNESIA) 400 MG/5ML suspension Take 30 mLs by mouth daily as needed for mild constipation.   Yes Historical Provider, MD  naproxen sodium (ANAPROX) 220 MG tablet Take  440 mg by mouth daily as needed (pain).   Yes Historical Provider, MD  predniSONE (DELTASONE) 50 MG tablet Take 200-250 mg by mouth daily as needed (allergic reaction).   Yes Historical Provider, MD  zolpidem (AMBIEN) 5 MG tablet TAKE 1/2 TABLET AT BEDTIME WHEN NEEDED 05/12/15  Yes Roderick Pee, MD  LORazepam (ATIVAN) 0.5 MG tablet 1 by mouth each bedtime when necessary for sleep 07/25/14   Roderick Pee, MD  ondansetron (ZOFRAN-ODT) 8 MG disintegrating tablet Take 1 tablet (8 mg total) by mouth every 8 (eight) hours as needed for nausea or vomiting. 10/13/15   Benjiman Core, MD  oxyCODONE-acetaminophen (PERCOCET/ROXICET) 5-325 MG tablet Take 1-2 tablets by  mouth every 6 (six) hours as needed for severe pain. 10/13/15   Benjiman Core, MD  valACYclovir (VALTREX) 1000 MG tablet TAKE 1 TABLET (1,000 MG TOTAL) BY MOUTH 2 (TWO) TIMES DAILY. 10/25/14   Roderick Pee, MD   BP 129/81 mmHg  Pulse 124  Temp(Src) 98.6 F (37 C) (Oral)  Resp 17  SpO2 100% Physical Exam  Constitutional: She appears well-developed.  HENT:  Head: Normocephalic.  Eyes: EOM are normal.  Neck: Neck supple.  Cardiovascular:  Tachycardia  Pulmonary/Chest: Effort normal.  Abdominal: Soft.  Moderate tenderness somewhat diffusely but appears to be worse in the right midabdomen. No hernias. Mild distention.  Musculoskeletal: Normal range of motion.  Neurological: She is alert.  Skin: Skin is warm.    ED Course  Procedures (including critical care time) Labs Review Labs Reviewed  COMPREHENSIVE METABOLIC PANEL - Abnormal; Notable for the following:    Sodium 134 (*)    Chloride 100 (*)    CO2 17 (*)    Glucose, Bld 105 (*)    Albumin 3.4 (*)    Anion gap 17 (*)    All other components within normal limits  CBC - Abnormal; Notable for the following:    WBC 11.9 (*)    RBC 3.35 (*)    HCT 35.3 (*)    MCV 105.4 (*)    MCH 37.0 (*)    All other components within normal limits  URINALYSIS, ROUTINE W REFLEX MICROSCOPIC (NOT AT [facility]) - Abnormal; Notable for the following:    APPearance CLOUDY (*)    Hgb urine dipstick MODERATE (*)    All other components within normal limits  URINE MICROSCOPIC-ADD ON - Abnormal; Notable for the following:    Squamous Epithelial / LPF 0-5 (*)    Bacteria, UA FEW (*)    All other components within normal limits  LIPID PANEL - Abnormal; Notable for the following:    Cholesterol 490 (*)    Triglycerides 899 (*)    All other components within normal limits  LIPASE, BLOOD  URINE RAPID DRUG SCREEN, HOSP PERFORMED  I-STAT BETA HCG BLOOD, ED ([facility], [facility], [facility] ONLY)    Imaging Review Ct Abdomen Pelvis W Contrast  10/13/2015  CLINICAL  DATA:  Acute abdominal pain, more severe on the right. Intermittent fever EXAM: CT ABDOMEN AND PELVIS WITH CONTRAST TECHNIQUE: Multidetector CT imaging of the abdomen and pelvis was performed using the standard protocol following bolus administration of intravenous contrast. Oral contrast was also administered. CONTRAST:  80mL OMNIPAQUE IOHEXOL 300 MG/ML  SOLN COMPARISON:  None. FINDINGS: Lower chest:  Lung bases are clear. Hepatobiliary: Liver is prominent, measuring 21.3 cm in length. There is hepatic steatosis. No focal liver lesions are identified. Gallbladder wall is not appreciably thickened. There is no biliary duct  dilatation. Pancreas: There is mild edema of the proximal head and uncinate process of the pancreas. Fluid tracks from this region slightly posterior as well as to the right with fluid extending to the medial liver edge on the right and inferiorly into the right pericolic gutter and pelvis with a small amount of fluid extending as low as the upper cul-de-sac on the right. There is no associated pancreatic mass or pseudocyst. The tail and body of the pancreas appear unremarkable. The pancreatic duct is not appreciably dilated. Spleen: No splenic lesions are identified. Adrenals/Urinary Tract: Adrenals appear normal bilaterally. Kidneys bilaterally show no mass or hydronephrosis on either side. There is no renal or ureteral calculus on either side. The midportion of the right ureter extends through an area of fluid, presumably from the pancreatitis, with subtle irregularity in the right ureter in this area, likely of inflammatory etiology secondary to the adjacent peripancreatic fluid. Stomach/Bowel: There is mild wall thickening in the descending portion of the duodenum near the area of pancreatitis. No other bowel wall thickening is seen on this study. The mesentery is not thickened. No bowel obstruction. No free air or portal venous air. Vascular/Lymphatic: There is no abdominal aortic aneurysm.  Major mesenteric vessels appear patent. There is no demonstrable adenopathy in the abdomen or pelvis. Reproductive: The uterus is anteverted. No pelvic mass. Fluid in the pelvis is felt to be secondary to the pancreatitis more superiorly. Other: The appendix appears normal, although apparent peripancreatic fluid tracks near the appendix. No demonstrable ascites. Musculoskeletal: There are no blastic or lytic bone lesions. No intramuscular or abdominal wall lesions. IMPRESSION: Findings consistent with a degree of acute pancreatitis arising from the proximal pancreatic head and uncinate process. Fluid extends from the pancreatic head region toward the right. Fluid tracks to the level of the liver and inferiorly along the right pericolic gutter into the right pelvis. Fluid surrounds the second portion of the duodenum with mild wall thickening in this area, probably due to secondary duodenitis from the adjacent pancreatitis. There is no other appreciable bowel wall thickening. The appendix is normal in size and contour, although peripancreatic fluid tracks along the course of the appendix. Peripancreatic fluid surrounds a portion of the mid right ureter with slight irregularity of this ureter but no frank ureterectasis and no hydronephrosis. There is no hydronephrosis or ureteral calculus on either side. No bowel obstruction or abscess. Prominent liver with hepatic steatosis. Electronically Signed   By: Bretta Bang III M.D.   On: 10/13/2015 13:00   I have personally reviewed and evaluated these images and lab results as part of my medical decision-making.   EKG Interpretation None      MDM   Final diagnoses:  Acute pancreatitis, unspecified complication status, unspecified pancreatitis type    Patient with abdominal pain and new onset pancreatitis. Lipids have been elevated and could be the cause. Patient is eager to go home. Seen by hospitalist because continue tachycardia. She is comfortable  going home and will be discharged.   Benjiman Core, MD 10/15/15 [phone]

## 2015-10-13 NOTE — ED Notes (Signed)
Awake. Verbally responsive. A/O x4. Resp even and unlabored. No audible adventitious breath sounds noted. ABC's intact. IV infusing NS at 500ml/hr without difficulty. 

## 2015-10-13 NOTE — ED Notes (Signed)
Awake. Verbally responsive. Resp even and unlabored. No audible adventitious breath sounds noted. ABC's intact. Abd soft/nondistended but tender to palpate. BS (+) and active x4 quadrants. No N/V/D reported. 

## 2015-10-13 NOTE — ED Notes (Signed)
Admitting MD at bedside.

## 2015-10-13 NOTE — ED Notes (Signed)
I ATTEMPTED TO COLLECT LABS AND WAS UNSUCCESSFUL.  I MADE THE NURSE AWARE. 

## 2015-10-13 NOTE — ED Notes (Signed)
Dr.Pickering at bedside doing bedside US.

## 2015-10-13 NOTE — Progress Notes (Signed)
   Subjective:    Patient ID: Kathleen Rubio, female    DOB: 1976-01-25, 40 y.o.   MRN: [id number]  HPI  Kathleen Rubio is a 40 year old single female nonsmoker who comes in today for evaluation of acute abdominal pain  She states she felt okay until midmorning on this past Saturday when she knows the gradual onset of right and left lower quadrant abdominal pain. Over the next couple hours the pain became worse and became more focused in her right lower and right upper quadrant with some radiation to the right side of her back. She also had some fever chills and bloating. Temperature was up to 102. She has no nausea vomiting. She took a laxative and had 2 fairly normal bowel movements yesterday. No blood or bleeding.  No urinary tract symptoms  We send her to GYN a year ago for evaluation of abdominal pain turned out she had noticed very small 2-3 mm left ovarian cyst.  Family history negative for gallbladder disease  Review of Systems    review of systems otherwise negative except for abdominal bloating Objective:   Physical Exam  Well-developed well-nourished female in acute distress.  In the supine position her abdomen. Bloated. Bowel sounds were somewhat hyperactive. There is tenderness in the right and the left upper quadrant with positive rebound. Pelvic examination shows no uterine or ovarian tenderness. Stool was light yellow guaiac negative      Assessment & Plan:  Acute abdominal pain............. question early appendicitis........ to the ER now for evaluation

## 2015-10-13 NOTE — Progress Notes (Signed)
Pre visit review using our clinic review tool, if applicable. No additional management support is needed unless otherwise documented below in the visit note. 

## 2015-10-13 NOTE — ED Notes (Signed)
Awake. Verbally responsive. A/O x4. Resp even and unlabored. No audible adventitious breath sounds noted. ABC's intact.  

## 2015-10-15 ENCOUNTER — Encounter: Payer: Self-pay | Admitting: Family Medicine

## 2015-10-15 ENCOUNTER — Ambulatory Visit (INDEPENDENT_AMBULATORY_CARE_PROVIDER_SITE_OTHER): Payer: Self-pay | Admitting: Family Medicine

## 2015-10-15 VITALS — BP 120/80 | Temp 98.5°F | Wt 119.0 lb

## 2015-10-15 DIAGNOSIS — E781 Pure hyperglyceridemia: Secondary | ICD-10-CM

## 2015-10-15 DIAGNOSIS — K8500 Idiopathic acute pancreatitis without necrosis or infection: Secondary | ICD-10-CM

## 2015-10-15 NOTE — Progress Notes (Signed)
   Subjective:    Patient ID: Kathleen Rubio, female    DOB: 22-Nov-1975, 40 y.o.   MRN: [id number]  HPI Kathleen Rubio is a [age]-year-old single female nonsmoker who comes in today for reevaluation of abdominal painyear-old single female nonsmoker who comes in today for reevaluation of abdominal pain  We saw her on Monday with severe abdominal pain for 48 hours. He turned out to be pancreatitis. She's had a history of abnormal lipids and high triglycerides. She been trying to manage it with diet. In the past she's declined medication. However now she's willing to take medication.   Review of Systems Review of systems otherwise negative except she still has some persistent abdominal bloating and pain    Objective:   Physical Exam  Well-developed well-nourished female no acute distress vital signs stable she's afebrile the abdomen is much less distended than it was on Monday. There still some diffuse tenderness no rebound      Assessment & Plan:  Acute pancreatitis resolving with bedrest liquids and pain medication........... continue that process until she is pain-free.......... then start Pravachol 20 mg daily follow-up lipid panel

## 2015-10-15 NOTE — Progress Notes (Signed)
Pre visit review using our clinic review tool, if applicable. No additional management support is needed unless otherwise documented below in the visit note. 

## 2015-10-15 NOTE — Patient Instructions (Signed)
Pravachol 20 mg,,,,,,,,,,,,,,,,,, one tablet daily at bedtime starting next Monday  Follow-up and 2 weeks with me  Anterior completely pain-free and the bloating is gone stay on a liquid diet as we outlined  When you resume eating [employer] complete fat-free diet  We will also get you set up a consult and nutrition to help look at all the dietary possibilities

## 2015-10-16 ENCOUNTER — Telehealth: Payer: Self-pay | Admitting: Family Medicine

## 2015-10-16 NOTE — Telephone Encounter (Signed)
PLEASE NOTE: All timestamps contained within this report are represented as [place] Standard Time. CONFIDENTIALTY NOTICE: This fax transmission is intended only for the addressee. It contains information that is legally privileged, confidential or otherwise protected from use or disclosure. If you are not the intended recipient, you are strictly prohibited from reviewing, disclosing, copying using or disseminating any of this information or taking any action in reliance on or regarding this information. If you have received this fax in error, please notify us immediately by telephone so that we can arrange for its return to [place]. Phone: [phone], Toll-Free: [phone], Fax: [phone] Page: 1 of 2 Call Id: [id number] [facility] - Client TELEPHONE ADVICE RECORD [facility] Patient Name: Kathleen Rubio DOB: 1976-05-10 Initial Comment caller states she was dx with pancreatitis this week - had her first bowel movement last night - is having more pain today Nurse Assessment Nurse: Elijah Birk, RN, Lynda Date/Time (Eastern Time): 10/16/2015 8:14:20 AM Confirm and document reason for call. If symptomatic, describe symptoms. You must click the next button to save text entered. ---Caller states she was dx with pancreatitis this week. Had a follow-up visit, things looked better. She had her first bowel movement last night, about 5 of them back to back. Had somewhat strained. Only had to take 1/2 of Percocet. This is related to high cholesterol. She is having more pain today. She is on a liquid diet. Has sharp pain in the lower right abdomen, a 6.5 on pain scale. Has the patient traveled out of the country within the last 30 days? ---Not Applicable Does the patient have any new or worsening symptoms? ---Yes Will a triage be completed? ---Yes Related visit to physician within the last 2 weeks? ---Yes Does the PT have any chronic conditions? (i.e.  diabetes, asthma, etc.) ---No Did the patient indicate they were pregnant? ---No Is this a behavioral health or substance abuse call? ---No Guidelines Guideline Title Affirmed Question Affirmed Notes Abdominal Pain - Female [1] MODERATE (e.g., interferes with normal activities) AND [2] pain comes and goes (cramps) AND [3] present > 24 hours (Exception: pain with Vomiting or Diarrhea - see that Guideline) Final Disposition User See Physician within 24 Hours [place], RN, Stark Bray Comments Please contact caller with further advice regarding her pancreatitis. She was trying to stretch out pain medicine (Oxycodone 5/325) and take less, but following several bowel movements last night, her pain increased and she had to take the regular dosage. She may run out over the next several days (weekend) if she has to take it more frequently. She is continuing on her liquid diet. Referrals REFERRED TO PCP OFFICE Disagree/Comply: Comply

## 2015-10-16 NOTE — Telephone Encounter (Signed)
error 

## 2015-10-16 NOTE — Telephone Encounter (Signed)
Per Dr Tawanna Cooler, if patient is still having pain or if her pain increases then she should go to the ER. Left detailed message on machine for patient.

## 2015-10-29 ENCOUNTER — Ambulatory Visit: Payer: Self-pay | Admitting: Family Medicine

## 2015-11-07 ENCOUNTER — Other Ambulatory Visit: Payer: Self-pay | Admitting: Family Medicine

## 2015-11-25 ENCOUNTER — Telehealth: Payer: Self-pay | Admitting: Genetic Counselor

## 2015-11-25 NOTE — Telephone Encounter (Signed)
Patient just received bill from over a year ago for $5200.  Annette StableBill [facility] but [facility] states [facility] not.  Did not receive a call from the lab about a high oOP cost.  Emailed Royston Bakeussell Talley and Ruta HindsSara Wienke about this and will get back to patient.

## 2015-11-28 ENCOUNTER — Ambulatory Visit (INDEPENDENT_AMBULATORY_CARE_PROVIDER_SITE_OTHER): Payer: Self-pay | Admitting: *Deleted

## 2015-11-28 DIAGNOSIS — N938 Other specified abnormal uterine and vaginal bleeding: Secondary | ICD-10-CM

## 2015-11-28 MED ORDER — MEDROXYPROGESTERONE ACETATE 150 MG/ML IM SUSP
150.0000 mg | Freq: Once | INTRAMUSCULAR | Status: AC
  Administered 2015-11-28: 150 mg via INTRAMUSCULAR

## 2015-12-04 ENCOUNTER — Other Ambulatory Visit: Payer: Self-pay | Admitting: Family Medicine

## 2016-02-19 ENCOUNTER — Ambulatory Visit (INDEPENDENT_AMBULATORY_CARE_PROVIDER_SITE_OTHER): Payer: Commercial Managed Care - PPO | Admitting: *Deleted

## 2016-02-19 DIAGNOSIS — N938 Other specified abnormal uterine and vaginal bleeding: Secondary | ICD-10-CM | POA: Diagnosis not present

## 2016-02-19 MED ORDER — MEDROXYPROGESTERONE ACETATE 150 MG/ML IM SUSP
150.0000 mg | Freq: Once | INTRAMUSCULAR | Status: AC
  Administered 2016-02-19: 150 mg via INTRAMUSCULAR

## 2016-03-19 ENCOUNTER — Ambulatory Visit: Payer: Commercial Managed Care - PPO | Admitting: Adult Health

## 2016-03-19 DIAGNOSIS — Z0289 Encounter for other administrative examinations: Secondary | ICD-10-CM

## 2016-04-15 ENCOUNTER — Other Ambulatory Visit: Payer: Self-pay | Admitting: Family Medicine

## 2016-04-15 DIAGNOSIS — Z1231 Encounter for screening mammogram for malignant neoplasm of breast: Secondary | ICD-10-CM

## 2016-05-14 ENCOUNTER — Ambulatory Visit: Payer: Commercial Managed Care - PPO

## 2016-05-14 ENCOUNTER — Ambulatory Visit: Payer: Commercial Managed Care - PPO | Admitting: *Deleted

## 2016-05-20 ENCOUNTER — Encounter: Payer: Self-pay | Admitting: Family Medicine

## 2016-05-20 ENCOUNTER — Ambulatory Visit (INDEPENDENT_AMBULATORY_CARE_PROVIDER_SITE_OTHER): Payer: Commercial Managed Care - PPO | Admitting: Family Medicine

## 2016-05-20 DIAGNOSIS — G4700 Insomnia, unspecified: Secondary | ICD-10-CM | POA: Insufficient documentation

## 2016-05-20 DIAGNOSIS — Z3042 Encounter for surveillance of injectable contraceptive: Secondary | ICD-10-CM | POA: Diagnosis not present

## 2016-05-20 DIAGNOSIS — L509 Urticaria, unspecified: Secondary | ICD-10-CM | POA: Insufficient documentation

## 2016-05-20 DIAGNOSIS — Z309 Encounter for contraceptive management, unspecified: Secondary | ICD-10-CM | POA: Insufficient documentation

## 2016-05-20 DIAGNOSIS — F17200 Nicotine dependence, unspecified, uncomplicated: Secondary | ICD-10-CM

## 2016-05-20 DIAGNOSIS — E782 Mixed hyperlipidemia: Secondary | ICD-10-CM | POA: Insufficient documentation

## 2016-05-20 LAB — LDL CHOLESTEROL, DIRECT: Direct LDL: 181 mg/dL

## 2016-05-20 LAB — COMPREHENSIVE METABOLIC PANEL
ALT: 37 U/L — ABNORMAL HIGH (ref 0–35)
AST: 48 U/L — ABNORMAL HIGH (ref 0–37)
Albumin: 4.2 g/dL (ref 3.5–5.2)
Alkaline Phosphatase: 71 U/L (ref 39–117)
BUN: 10 mg/dL (ref 6–23)
CO2: 27 mEq/L (ref 19–32)
Calcium: 9.3 mg/dL (ref 8.4–10.5)
Chloride: 102 mEq/L (ref 96–112)
Creatinine, Ser: 0.71 mg/dL (ref 0.40–1.20)
GFR: 96.78 mL/min (ref >60.00)
Glucose, Bld: 102 mg/dL — ABNORMAL HIGH (ref 70–99)
Potassium: 4.2 mEq/L (ref 3.5–5.1)
Sodium: 139 mEq/L (ref 135–145)
Total Bilirubin: 0.5 mg/dL (ref 0.2–1.2)
Total Protein: 7 g/dL (ref 6.0–8.3)

## 2016-05-20 LAB — LIPID PANEL
Cholesterol: 352 mg/dL — ABNORMAL HIGH (ref 0–200)
HDL: 74.9 mg/dL (ref >39.00)
Total CHOL/HDL Ratio: 5
Triglycerides: 580 mg/dL — ABNORMAL HIGH (ref 0.0–149.0)

## 2016-05-20 MED ORDER — VARENICLINE TARTRATE 0.5 MG X 11 & 1 MG X 42 PO MISC: ORAL | 0 refills | Status: AC

## 2016-05-20 MED ORDER — ZOLPIDEM TARTRATE 5 MG PO TABS: 5.0000 mg | ORAL_TABLET | Freq: Every evening | ORAL | 2 refills | Status: DC | PRN

## 2016-05-20 MED ORDER — VARENICLINE TARTRATE 1 MG PO TABS
1.0000 mg | ORAL_TABLET | Freq: Two times a day (BID) | ORAL | 2 refills | Status: DC
Start: 2016-05-20 — End: 2016-10-18

## 2016-05-20 MED ORDER — MEDROXYPROGESTERONE ACETATE 150 MG/ML IM SUSP
150.0000 mg | Freq: Once | INTRAMUSCULAR | Status: AC
  Administered 2016-05-20: 150 mg via INTRAMUSCULAR

## 2016-05-20 MED ORDER — MELATONIN ER 5 MG PO TBCR
5.0000 mg | EXTENDED_RELEASE_TABLET | Freq: Every day | ORAL | 3 refills | Status: DC
Start: 2016-05-20 — End: 2017-03-11

## 2016-05-20 MED ORDER — AMITRIPTYLINE HCL 25 MG PO TABS: 25.0000 mg | ORAL_TABLET | Freq: Every day | ORAL | 2 refills | Status: DC

## 2016-05-20 MED ORDER — EPINEPHRINE 0.3 MG/0.3ML IJ SOAJ: 0.3000 mg | Freq: Once | INTRAMUSCULAR | 1 refills | Status: AC

## 2016-05-20 MED ORDER — MEDROXYPROGESTERONE ACETATE 150 MG/ML IM SUSP: 150.0000 mg | INTRAMUSCULAR | 11 refills | Status: DC

## 2016-05-20 NOTE — Progress Notes (Signed)
Pre visit review using our clinic review tool, if applicable. No additional management support is needed unless otherwise documented below in the visit note. 

## 2016-05-20 NOTE — Progress Notes (Signed)
HPI:   [name] is a 40 y.o. female, who is here today to establish care with me.  Former PCP: Dr Kathleen Rubio. Last preventive routine visit: 1-2 years ago.  Concerns today: medications refill.   Today she is due for her Kathleen Rubio injection, she denies missing any dose, no vaginal bleeding or discharge. She has been on this medication for contraception since early 20's, about 18 years. She denies any history of abnormal Pap smear.  Insomnia: She is currently on Kathleen Rubio 2.5 mg at bedtime and Kathleen Rubio 25 mg at bedtime, still having trouble staying asleep, sleeps about 4 hours then wakes up and cannot go back to sleep. She denies any side effect from the medication.    Hyperlipidemia:  Currently on non pharmacologic treatment, she was on Kathleen Rubio 20 mg before, not sure why she discontinued it.  Following a low fat diet: Yes.    Lab Results  Component Value Date   CHOL 490 (H) 10/13/2015   HDL 47 10/13/2015   LDLCALC UNABLE TO CALCULATE IF TRIGLYCERIDE OVER 400 mg/dL 16/06/9603   LDLDIRECT 137.7 07/17/2014   TRIG 899 (H) 10/13/2015   CHOLHDL 10.4 10/13/2015   09/2015 episode of acute pancreatitis, symptoms completely resolved. She mentions that since then occasionally she will have bloating sensation and episode of diarrhea after eating certain food.     Chemistry      Component Value Date/Time   NA 134 (L) 10/13/2015 1047   K 4.0 10/13/2015 1047   CL 100 (L) 10/13/2015 1047   CO2 17 (L) 10/13/2015 1047   BUN 6 10/13/2015 1047   CREATININE 0.64 10/13/2015 1047      Component Value Date/Time   CALCIUM 8.9 10/13/2015 1047   ALKPHOS 113 10/13/2015 1047   AST 39 10/13/2015 1047   ALT 24 10/13/2015 1047   BILITOT 0.9 10/13/2015 1047      She had an episode of anaphylactic reaction with urticaria, unknown etiology. She has not had an episode in a couple years, she carries Kathleen Rubio, she needs refills.  She is interested in quitting the smoking, she had an  old prescription for Kathleen Rubio and recently resumed it, she denies any side effect when taken before. Occasionally she can hear "rattling" in her lungs. She is not having cough, no dyspnea or chest pain.   Review of Systems  Constitutional: Positive for fatigue (no more than usual). Negative for activity change, appetite change, fever and unexpected weight change.  HENT: Negative for mouth sores, nosebleeds, sore throat and trouble swallowing.   Eyes: Negative for redness and visual disturbance.  Respiratory: Negative for cough, shortness of breath and wheezing.   Cardiovascular: Negative for chest pain, palpitations and leg swelling.  Gastrointestinal: Negative for abdominal pain, nausea and vomiting.       Negative for changes in bowel habits.  Genitourinary: Negative for decreased urine volume, difficulty urinating, dysuria, hematuria, pelvic pain, vaginal bleeding and vaginal discharge.  Musculoskeletal: Negative for back pain and myalgias.  Skin: Negative for rash and wound.  Neurological: Negative for seizures, syncope, weakness, numbness and headaches.  Psychiatric/Behavioral: Positive for sleep disturbance. Negative for confusion. The patient is not nervous/anxious.       Current Outpatient Prescriptions on File Prior to Visit  Medication Sig Dispense Refill  . valACYclovir (VALTREX) 1000 MG tablet TAKE 1 TABLET (1,000 MG TOTAL) BY MOUTH 2 (TWO) TIMES DAILY. 90 tablet 1  . aspirin-acetaminophen-caffeine (EXCEDRIN MIGRAINE) 250-250-65 MG tablet Take 2 tablets by mouth every  6 (six) hours as needed for headache.    . bisacodyl (DULCOLAX) 10 MG suppository Place 10 mg rectally as needed for mild constipation or moderate constipation.     No current facility-administered medications on file prior to visit.      Past Medical History:  Diagnosis Date  . Hyperlipidemia    No Known Allergies  Family History  Problem Relation Age of Onset  . Breast cancer Mother 2752    recurrence  at age 40  . Cancer Mother 1167    head/neck ca  . Diabetes Father     type 2  . Prostate cancer Father 4458    currently 4678  . Hyperlipidemia Father   . Breast cancer Maternal Grandmother     Dx 6650s; deceased 5850s    Social History   Social History  . Marital status: Married    Spouse name: N/A  . Number of children: N/A  . Years of education: N/A   Social History Main Topics  . Smoking status: Current Every Day Smoker  . Smokeless tobacco: Never Used     Comment: smokes 1pack every 2-4 days  . Alcohol use 2.4 oz/week    4 Standard drinks or equivalent per week     Comment: 2 glasses of wine per week  . Drug use: No  . Sexual activity: Yes    Partners: Male    Birth control/ protection: Injection     Comment: Depo Provera   Other Topics Concern  . None   Social History Narrative  . None    Vitals:   05/20/16 0808  BP: 120/86  Pulse: 88  Resp: 12  Temp: 98.2 F (36.8 C)    Body mass index is 20.91 kg/m.    Physical Exam  Nursing note and vitals reviewed. Constitutional: She is oriented to person, place, and time. She appears well-developed and well-nourished. No distress.  HENT:  Head: Atraumatic.  Mouth/Throat: Oropharynx is clear and moist and mucous membranes are normal.  Eyes: Conjunctivae and EOM are normal. Pupils are equal, round, and reactive to light.  Neck: No thyromegaly present.  Cardiovascular: Normal rate and regular rhythm.   No murmur heard. Pulses:      Dorsalis pedis pulses are 2+ on the right side, and 2+ on the left side.  Respiratory: Effort normal and breath sounds normal. No respiratory distress.  GI: Soft. She exhibits no mass. There is no hepatomegaly. There is no tenderness.  Musculoskeletal: She exhibits no edema.  Lymphadenopathy:    She has no cervical adenopathy.  Neurological: She is alert and oriented to person, place, and time. She has normal strength. Coordination normal.  Skin: Skin is warm. No erythema.    Psychiatric: She has a normal mood and affect.  Well groomed, good eye contact.      ASSESSMENT AND PLAN:     Kathleen Rubio was seen today for establish care.  Diagnoses and all orders for this visit:  Insomnia, unspecified  Still having trouble staying asleep. She will increase Kathleen Rubio from 2.5-5 mg at bedtime, continue Kathleen Rubio. 4 discussed some side effects of medications. Melatonin extended release 5 mg added. Good sleep hygiene. If still having trouble after increasing Kathleen Rubio, we will consider changing to Kathleen Rubio CR. Follow-up in 3 months.  -     Kathleen Rubio (ELAVIL) 25 MG tablet; Take 1 tablet (25 mg total) by mouth at bedtime. -     zolpidem (Kathleen Rubio) 5 MG tablet; Take 1 tablet (5 mg total) by  mouth at bedtime as needed for sleep. -     Melatonin ER 5 MG TBCR; Take 5 mg by mouth at bedtime.  Hyperlipemia, mixed  Continue low-fat diet. I will consider adding fenofibrate. Depending on lab results. Follow-up in 6-12 months.  -     Lipid panel -     CMP  Encounter for surveillance of injectable contraceptive  We discussed some side effects of Kathleen Rubio. Recommended good Ca++ intake and regular exercise. No changes in current management. Follow-up in one year.  -     medroxyPROGESTERone (Kathleen Rubio) 150 MG/ML injection; Inject 1 mL (150 mg total) into the muscle every 3 (three) months. -     medroxyPROGESTERone (Kathleen Rubio) injection 150 mg; Inject 1 mL (150 mg total) into the muscle once.  Tobacco use disorder  Adverse effects of tobacco use discussed, treatment options for smoking cessation also review. Some side effects of Kathleen Rubio discussed, she is interested in trying again.  -     varenicline (Kathleen Rubio STARTING MONTH PAK) 0.5 MG X 11 & 1 MG X 42 tablet; Take one 0.5 mg tablet by mouth once daily for 3 days, then increase to one 0.5 mg tablet twice daily for 4 days, then increase to one 1 mg tablet twice daily. -     varenicline (Kathleen Rubio CONTINUING MONTH  PAK) 1 MG tablet; Take 1 tablet (1 mg total) by mouth 2 (two) times daily.  Urticaria of unknown origin  Asymptomatic. Follow-up in a year, before if needed.  -     EPINEPHrine 0.3 mg/0.3 mL IJ SOAJ injection; Inject 0.3 mLs (0.3 mg total) into the muscle once.        -Next visit we are planning on a CPE with Pap smear.        Reisa Coppola G. [place], MD  [facility]. [facility] [facility].

## 2016-05-20 NOTE — Patient Instructions (Signed)
A few things to remember from today's visit:   Insomnia, unspecified - Plan: amitriptyline (ELAVIL) 25 MG tablet, zolpidem (AMBIEN) 5 MG tablet, Melatonin ER 5 MG TBCR  Hyperlipemia, mixed - Plan: Lipid panel, CMP  Encounter for surveillance of injectable contraceptive - Plan: medroxyPROGESTERone (DEPO-PROVERA) 150 MG/ML injection  Tobacco use disorder - Plan: varenicline (CHANTIX STARTING MONTH PAK) 0.5 MG X 11 & 1 MG X 42 tablet, varenicline (CHANTIX CONTINUING MONTH PAK) 1 MG tablet  Urticaria of unknown origin - Plan: EPINEPHrine 0.3 mg/0.3 mL IJ SOAJ injection  Adequate calcium intake because somebody her medications can increase risk of osteoporosis. Ambien increased from 2.5 mg to 5 mg at bedtime. Please let me know thorough my chart he just is having trouble sleeping, we could change Ambien to Ambien CR.  Melatonin extended release  Please be sure medication list is accurate. If a new problem present, please set up appointment sooner than planned today.

## 2016-05-26 ENCOUNTER — Other Ambulatory Visit: Payer: Self-pay

## 2016-05-26 ENCOUNTER — Telehealth: Payer: Self-pay | Admitting: Family Medicine

## 2016-05-26 MED ORDER — GEMFIBROZIL 600 MG PO TABS: 600.0000 mg | ORAL_TABLET | Freq: Two times a day (BID) | ORAL | 2 refills | Status: DC

## 2016-05-26 NOTE — Telephone Encounter (Signed)
Spoke with patient & gave her lab results. Patient verbalized understanding & rx sent to pharmacy.

## 2016-05-26 NOTE — Telephone Encounter (Signed)
Kathleen Rubio pt returned your call. °

## 2016-05-28 ENCOUNTER — Ambulatory Visit
Admission: RE | Admit: 2016-05-28 | Discharge: 2016-05-28 | Disposition: A | Discharge status: Home / Self Care | Payer: Commercial Managed Care - PPO | Source: Ambulatory Visit | Attending: Family Medicine | Admitting: Family Medicine

## 2016-05-28 DIAGNOSIS — Z1231 Encounter for screening mammogram for malignant neoplasm of breast: Secondary | ICD-10-CM

## 2016-06-09 ENCOUNTER — Other Ambulatory Visit: Payer: Self-pay | Admitting: Family Medicine

## 2016-06-09 DIAGNOSIS — R928 Other abnormal and inconclusive findings on diagnostic imaging of breast: Secondary | ICD-10-CM

## 2016-06-10 ENCOUNTER — Other Ambulatory Visit: Payer: Self-pay | Admitting: Family Medicine

## 2016-06-10 DIAGNOSIS — R1032 Left lower quadrant pain: Secondary | ICD-10-CM

## 2016-06-14 ENCOUNTER — Other Ambulatory Visit: Payer: Self-pay | Admitting: Family Medicine

## 2016-06-14 ENCOUNTER — Other Ambulatory Visit: Payer: Self-pay

## 2016-06-14 DIAGNOSIS — R928 Other abnormal and inconclusive findings on diagnostic imaging of breast: Secondary | ICD-10-CM

## 2016-06-15 ENCOUNTER — Ambulatory Visit
Admission: RE | Admit: 2016-06-15 | Discharge: 2016-06-15 | Disposition: A | Discharge status: Home / Self Care | Payer: Commercial Managed Care - PPO | Source: Ambulatory Visit | Attending: Family Medicine | Admitting: Family Medicine

## 2016-06-15 DIAGNOSIS — R928 Other abnormal and inconclusive findings on diagnostic imaging of breast: Secondary | ICD-10-CM

## 2016-06-22 ENCOUNTER — Other Ambulatory Visit: Payer: Self-pay | Admitting: Emergency Medicine

## 2016-06-22 ENCOUNTER — Telehealth: Payer: Self-pay | Admitting: *Deleted

## 2016-06-22 ENCOUNTER — Other Ambulatory Visit: Payer: Self-pay | Admitting: Family Medicine

## 2016-06-22 NOTE — Telephone Encounter (Signed)
Called and spoke with Altamease OilerNoelle at [facility]. The original referral had been put in by someone else that was not from this office. The mammogram was needed & was completed, and the doctor didn't think the ultrasound was needed. The order was already canceled on their end & the order will not be re entered on our end.

## 2016-06-22 NOTE — Telephone Encounter (Signed)
What we talked about

## 2016-06-22 NOTE — Telephone Encounter (Signed)
[facility]. Please input orders (2 separate orders)  [id number]  (ultrasound pelvis complete) [id number] (non-ob transvaginal)   Original communication was via [facility] and referral request of [facility] for Abd pain.   Contact [facility] for follow-up if needed.  [phone]  Please call and confirm orders have been placed. Patient's appointment will be pending until orders have been placed.

## 2016-06-23 NOTE — Telephone Encounter (Signed)
Noted  

## 2016-06-24 ENCOUNTER — Encounter: Payer: Self-pay | Admitting: Family Medicine

## 2016-06-28 ENCOUNTER — Other Ambulatory Visit: Payer: Self-pay

## 2016-06-28 MED ORDER — ZOLPIDEM TARTRATE ER 6.25 MG PO TBCR
6.2500 mg | EXTENDED_RELEASE_TABLET | Freq: Every evening | ORAL | 0 refills | Status: DC | PRN
Start: 2016-06-28 — End: 2016-07-24

## 2016-06-28 NOTE — Telephone Encounter (Signed)
Called the pharmacy and canceled the Ambien 5 mg Rx and the refills. New Rx for Ambien CR 6.25mg  #15/0rf called into pharmacy. Replied to patient's mychart message asking her to let us know how she is doing on the medication after a couple of nights. Per Dr. SwazilandJordan, if patient is doing well on medication, a 4-6 week follow up will need to be scheduled.

## 2016-06-29 ENCOUNTER — Ambulatory Visit (INDEPENDENT_AMBULATORY_CARE_PROVIDER_SITE_OTHER): Payer: Commercial Managed Care - PPO

## 2016-06-29 DIAGNOSIS — Z23 Encounter for immunization: Secondary | ICD-10-CM

## 2016-07-23 ENCOUNTER — Other Ambulatory Visit: Payer: Self-pay | Admitting: Family Medicine

## 2016-07-23 NOTE — Telephone Encounter (Signed)
Okay to refill? I thought we changed it to the 6.25?

## 2016-07-24 ENCOUNTER — Encounter: Payer: Self-pay | Admitting: Family Medicine

## 2016-07-24 ENCOUNTER — Other Ambulatory Visit: Payer: Self-pay | Admitting: Family Medicine

## 2016-07-25 NOTE — Telephone Encounter (Signed)
Rx was changed recently from Ambien to Ambien CR. Please call pharmacy to clarify request and cancel refills for Ambien 5 mg.  Thanks,BJ

## 2016-07-26 NOTE — Telephone Encounter (Signed)
Rx faxed to pharmacy  

## 2016-07-26 NOTE — Telephone Encounter (Signed)
Rx already sent in

## 2016-07-26 NOTE — Telephone Encounter (Signed)
Okay to refill? 

## 2016-08-10 ENCOUNTER — Ambulatory Visit (INDEPENDENT_AMBULATORY_CARE_PROVIDER_SITE_OTHER): Payer: Commercial Managed Care - PPO

## 2016-08-10 DIAGNOSIS — Z308 Encounter for other contraceptive management: Secondary | ICD-10-CM | POA: Diagnosis not present

## 2016-08-10 MED ORDER — MEDROXYPROGESTERONE ACETATE 150 MG/ML IM SUSP
150.0000 mg | Freq: Once | INTRAMUSCULAR | Status: AC
  Administered 2016-08-10: 150 mg via INTRAMUSCULAR

## 2016-09-30 ENCOUNTER — Telehealth: Payer: Self-pay | Admitting: Family Medicine

## 2016-09-30 ENCOUNTER — Telehealth: Payer: Commercial Managed Care - PPO | Admitting: Family

## 2016-09-30 ENCOUNTER — Encounter: Payer: Self-pay | Admitting: Family Medicine

## 2016-09-30 DIAGNOSIS — N390 Urinary tract infection, site not specified: Secondary | ICD-10-CM

## 2016-09-30 MED ORDER — CEPHALEXIN 500 MG PO CAPS: 500.0000 mg | ORAL_CAPSULE | Freq: Two times a day (BID) | ORAL | 0 refills | Status: DC

## 2016-09-30 NOTE — Telephone Encounter (Signed)
Pt seen via E-visit. Nothing further needed at this time.

## 2016-09-30 NOTE — Progress Notes (Signed)

## 2016-09-30 NOTE — Telephone Encounter (Signed)
[facility] - Client TELEPHONE ADVICE RECORD [facility] Patient Name: Kathleen Rubio DOB: 03/27/1976 Initial Comment Caller thinks she has a UTI and is wanting RX called in. Frequency and burning, is currently in [place] so unable to come to office Nurse Assessment Nurse: Loletta Specterorbin, RN, Misty StanleyLisa Date/Time Lamount Cohen(Eastern Time): 09/30/2016 11:01:01 AM Confirm and document reason for call. If symptomatic, describe symptoms. ---Caller states she thinks she has a UTI, frequency and burning. She is out of town currently. No fever. Symptoms started 2 days ago. Does the patient have any new or worsening symptoms? ---Yes Will a triage be completed? ---Yes Related visit to physician within the last 2 weeks? ---No Does the PT have any chronic conditions? (i.e. diabetes, asthma, etc.) ---No Is the patient pregnant or possibly pregnant? (Ask all females between the ages of 1612-55) ---No Is this a behavioral health or substance abuse call? ---No Guidelines Guideline Title Affirmed Question Affirmed Notes Urination Pain - Female All other patients with painful urination (Exception: [1] EITHER frequency or urgency AND [2] has on-call doctor) Final Disposition User See Physician within 24 Hours Loletta Specterorbin, RN, Misty StanleyLisa Comments Caller is currently out of town and unable to make an office visit. Telehealth option information sent via email. Referrals REFERRED TO PCP OFFICE Disagree/Comply: Disagree Disagree/Comply Reason: Disagree with instructions

## 2016-10-05 ENCOUNTER — Telehealth: Payer: Self-pay | Admitting: Family Medicine

## 2016-10-05 NOTE — Telephone Encounter (Signed)
Per Dr. Tawanna Coolerodd okay for pt to switch back.

## 2016-10-05 NOTE — Telephone Encounter (Signed)
Pt would like to have a CPE w/pap w/Dr. Tawanna Coolerodd due to her not feeling comfortable w/chosen provider.

## 2016-10-05 NOTE — Telephone Encounter (Signed)
Pt scheduled  

## 2016-10-18 ENCOUNTER — Encounter: Payer: Self-pay | Admitting: Family Medicine

## 2016-10-18 ENCOUNTER — Other Ambulatory Visit (HOSPITAL_COMMUNITY)
Admission: RE | Admit: 2016-10-18 | Discharge: 2016-10-18 | Disposition: A | Discharge status: Home / Self Care | Payer: Commercial Managed Care - PPO | Source: Ambulatory Visit | Attending: Family Medicine | Admitting: Family Medicine

## 2016-10-18 ENCOUNTER — Ambulatory Visit (INDEPENDENT_AMBULATORY_CARE_PROVIDER_SITE_OTHER): Payer: Commercial Managed Care - PPO | Admitting: Family Medicine

## 2016-10-18 VITALS — BP 150/98 | HR 108 | Temp 98.0°F | Ht 65.0 in | Wt 118.3 lb

## 2016-10-18 DIAGNOSIS — E785 Hyperlipidemia, unspecified: Secondary | ICD-10-CM

## 2016-10-18 DIAGNOSIS — Z308 Encounter for other contraceptive management: Secondary | ICD-10-CM | POA: Diagnosis not present

## 2016-10-18 DIAGNOSIS — E782 Mixed hyperlipidemia: Secondary | ICD-10-CM

## 2016-10-18 DIAGNOSIS — Z0000 Encounter for general adult medical examination without abnormal findings: Secondary | ICD-10-CM

## 2016-10-18 DIAGNOSIS — F17200 Nicotine dependence, unspecified, uncomplicated: Secondary | ICD-10-CM | POA: Diagnosis not present

## 2016-10-18 DIAGNOSIS — G4700 Insomnia, unspecified: Secondary | ICD-10-CM

## 2016-10-18 DIAGNOSIS — Z124 Encounter for screening for malignant neoplasm of cervix: Secondary | ICD-10-CM

## 2016-10-18 DIAGNOSIS — Z01419 Encounter for gynecological examination (general) (routine) without abnormal findings: Secondary | ICD-10-CM | POA: Insufficient documentation

## 2016-10-18 LAB — BASIC METABOLIC PANEL
BUN: 7 mg/dL (ref 6–23)
CO2: 22 mEq/L (ref 19–32)
Calcium: 9.4 mg/dL (ref 8.4–10.5)
Chloride: 104 mEq/L (ref 96–112)
Creatinine, Ser: 0.51 mg/dL (ref 0.40–1.20)
GFR: 141.49 mL/min (ref >60.00)
Glucose, Bld: 85 mg/dL (ref 70–99)
Potassium: 3.7 mEq/L (ref 3.5–5.1)
Sodium: 137 mEq/L (ref 135–145)

## 2016-10-18 LAB — LIPID PANEL
Cholesterol: 288 mg/dL — ABNORMAL HIGH (ref 0–200)
HDL: 102.9 mg/dL (ref >39.00)
Total CHOL/HDL Ratio: 3
Triglycerides: 526 mg/dL — ABNORMAL HIGH (ref 0.0–149.0)

## 2016-10-18 LAB — CBC WITH DIFFERENTIAL/PLATELET
Basophils Absolute: 0.1 10*3/uL (ref 0.0–0.1)
Basophils Relative: 0.6 % (ref 0.0–3.0)
Eosinophils Absolute: 0 10*3/uL (ref 0.0–0.7)
Eosinophils Relative: 0.5 % (ref 0.0–5.0)
HCT: 39.4 % (ref 36.0–46.0)
Hemoglobin: 13.6 g/dL (ref 12.0–15.0)
Lymphocytes Relative: 23.5 % (ref 12.0–46.0)
Lymphs Abs: 2 10*3/uL (ref 0.7–4.0)
MCHC: 34.5 g/dL (ref 30.0–36.0)
MCV: 106.8 fl — ABNORMAL HIGH (ref 78.0–100.0)
Monocytes Absolute: 0.5 10*3/uL (ref 0.1–1.0)
Monocytes Relative: 5.6 % (ref 3.0–12.0)
Neutro Abs: 6 10*3/uL (ref 1.4–7.7)
Neutrophils Relative %: 69.8 % (ref 43.0–77.0)
Platelets: 348 10*3/uL (ref 150.0–400.0)
RBC: 3.69 Mil/uL — ABNORMAL LOW (ref 3.87–5.11)
RDW: 13.4 % (ref 11.5–15.5)
WBC: 8.6 10*3/uL (ref 4.0–10.5)

## 2016-10-18 LAB — HEPATIC FUNCTION PANEL
ALT: 12 U/L (ref 0–35)
AST: 19 U/L (ref 0–37)
Albumin: 4.3 g/dL (ref 3.5–5.2)
Alkaline Phosphatase: 71 U/L (ref 39–117)
Bilirubin, Direct: 0.1 mg/dL (ref 0.0–0.3)
Total Bilirubin: 0.4 mg/dL (ref 0.2–1.2)
Total Protein: 6.8 g/dL (ref 6.0–8.3)

## 2016-10-18 LAB — POCT URINALYSIS DIPSTICK
Bilirubin, UA: NEGATIVE
Glucose, UA: NEGATIVE
Leukocytes, UA: NEGATIVE
Nitrite, UA: NEGATIVE
Protein, UA: NEGATIVE
Spec Grav, UA: 1.01
Urobilinogen, UA: 0.2
pH, UA: 6

## 2016-10-18 LAB — TSH: TSH: 1.21 u[IU]/mL (ref 0.35–4.50)

## 2016-10-18 LAB — LDL CHOLESTEROL, DIRECT: Direct LDL: 121 mg/dL

## 2016-10-18 MED ORDER — ZOLPIDEM TARTRATE ER 6.25 MG PO TBCR: 6.2500 mg | EXTENDED_RELEASE_TABLET | Freq: Every evening | ORAL | 4 refills | Status: DC | PRN

## 2016-10-18 MED ORDER — VARENICLINE TARTRATE 1 MG PO TABS: 1.0000 mg | ORAL_TABLET | Freq: Two times a day (BID) | ORAL | 10 refills | Status: DC

## 2016-10-18 MED ORDER — AMITRIPTYLINE HCL 50 MG PO TABS: 50.0000 mg | ORAL_TABLET | Freq: Every day | ORAL | 4 refills | Status: DC

## 2016-10-18 MED ORDER — GEMFIBROZIL 600 MG PO TABS: 600.0000 mg | ORAL_TABLET | Freq: Two times a day (BID) | ORAL | 4 refills | Status: DC

## 2016-10-18 NOTE — Progress Notes (Signed)
Pre visit review using our clinic review tool, if applicable. No additional management support is needed unless otherwise documented below in the visit note. 

## 2016-10-18 NOTE — Progress Notes (Signed)
Ander SladeJoy is a 41 year old single female smoker who comes in today for general physical examination because of a number of issues  She takes Elavil 25 mg at bedtime for sleep but still can't sleep so she takes Ambien CR 6.25 mg. She's tried trazodone in the past but it didn't help. I recommend she increase the Elavil and try to get off the Ambien.  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  She started back on the Chantix 20 days ago. She takes a milligram daily. She's taper down to 5 cigarettes a day. We've outlined a cessation program  Initially her blood pressure was elevated 150/98. She states her father had hypertension. She had trouble with her blood pressure being elevated in her 20s with diet and exercise it went away.  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  She gets routine eye care, dental care, BSE monthly, annual mammography, colonoscopy not until age 41. No family history of colon cancer polyps.  Family history father had hypertension. No brothers no sisters.  14 point review of systems reviewed and otherwise negative except for above  Vaccination history up-to-date tetanus 2012 flu shot fall 2017.  EKG done because a history of hyperlipidemia and palpitations. EKG was unchanged  BP (!) 150/98 (BP Location: Right Arm, Patient Position: Sitting, Cuff Size: Normal)   Pulse (!) 108   Temp 98 F (36.7 C) (Oral)   Ht 5\' 5"  (1.651 m)   Wt 118 lb 4.8 oz (53.7 kg)   BMI 19.69 kg/m  Examination HEENT were negative neck was supple thyroid normal no carotid bruits cardiopulmonary exam normal breast exam normal abdominal exam normal pelvic examination external genitalia within normal limits vaginal vault was normal cervix is visualized Pap smear was done bimanual exam negative rectum normal  stool guaiac negative extremities normal skin normal peripheral pulses normal except for cystic acne lesions on her anterior chest wall....... she took doxycycline and Accutane as a teenager..........Marland Kitchen.  #1 tobacco abuse........... outlined smoking cessation program follow-up in 6 weeks  #2 elevated blood pressure....... BP check every morning at home for 4 weeks. If blood pressure normal then check weekly if not return for reevaluation  #3 dysfunction uterine bleeding........ continue Depo-Provera  #4 history of palpitations etiology unknown  #5 sleep dysfunction........ increase Elavil by 25 mg every month to see if we can get her off the Ambien  .

## 2016-10-18 NOTE — Patient Instructions (Signed)
Purchase an Omron pump up digital blood pressure cuff,,,,,,,, [employer],,,,,,, check your blood pressure daily in the morning  Blood pressure goal 135/85 or less,,,,,,,,, if after one month your data shows that your blood pressure is normal then I would just check it once weekly because of your family history of hypertension. However if you see over the next month all your blood pressures are elevated return to see me with the data and the device for follow-up  Increase the Elavil to 50 mg daily,,,,,,,,, let's try to increase that by 25 mg every month and to use sleep better to get off the Ambien  Chantix 1 mg,,,,,,, 1 daily in the morning,,,,,,,, May had a half a tab in the evening when necessary,,,,,,,,, taper by one per week,,,,,,,. Once her off the cigarettes completely you might need to stay on the Chantix for 6-12 months  Labs today......Marland Kitchen. we'll call you the report if any things abnormal.

## 2016-10-20 LAB — CYTOLOGY - PAP: Diagnosis: NEGATIVE

## 2016-10-26 ENCOUNTER — Encounter: Payer: Self-pay | Admitting: Emergency Medicine

## 2016-10-27 ENCOUNTER — Telehealth: Payer: Self-pay | Admitting: Family Medicine

## 2016-10-27 ENCOUNTER — Other Ambulatory Visit: Payer: Self-pay | Admitting: Emergency Medicine

## 2016-10-27 DIAGNOSIS — E782 Mixed hyperlipidemia: Secondary | ICD-10-CM

## 2016-10-27 NOTE — Telephone Encounter (Signed)
Pt returning your call concerning lab results °

## 2016-10-27 NOTE — Telephone Encounter (Signed)
Left a voicemail for pt to return my call.

## 2016-10-27 NOTE — Telephone Encounter (Signed)
Sent in referral for Cardiology

## 2016-11-05 ENCOUNTER — Encounter: Payer: Self-pay | Admitting: Cardiovascular Disease

## 2016-11-05 ENCOUNTER — Ambulatory Visit (INDEPENDENT_AMBULATORY_CARE_PROVIDER_SITE_OTHER): Payer: Managed Care, Other (non HMO) | Admitting: Cardiovascular Disease

## 2016-11-05 VITALS — BP 138/84 | HR 98 | Ht 64.0 in | Wt 118.0 lb

## 2016-11-05 DIAGNOSIS — E7849 Other hyperlipidemia: Secondary | ICD-10-CM

## 2016-11-05 DIAGNOSIS — I10 Essential (primary) hypertension: Secondary | ICD-10-CM | POA: Diagnosis not present

## 2016-11-05 DIAGNOSIS — F17200 Nicotine dependence, unspecified, uncomplicated: Secondary | ICD-10-CM | POA: Diagnosis not present

## 2016-11-05 DIAGNOSIS — E784 Other hyperlipidemia: Secondary | ICD-10-CM | POA: Diagnosis not present

## 2016-11-05 NOTE — Patient Instructions (Signed)
Medication Instructions: Your physician recommends that you continue on your current medications as directed. Please refer to the Current Medication list given to you today.   Follow-Up: You have been referred to Hulda MarinJohn Guyton, MD at [facility] for Familial Hyperlipidemia.  Your physician recommends that you schedule a follow-up appointment as needed with Dr. Allyson SabalBerry.    If you need a refill on your cardiac medications before your next appointment, please call your pharmacy.

## 2016-11-05 NOTE — Assessment & Plan Note (Signed)
History of essential hypertension blood pressure measured today at 138/84. She is not on antihypertensive medications. She attributes some of her blood pressure issues to anxiety

## 2016-11-05 NOTE — Assessment & Plan Note (Signed)
Ms. Cyndia Bentu has hyperlipidemia/hypertriglyceridemia on Lopid which she takes intermittently. Her triglycerides have been up to 900 and her total cholesterol runs in the 3-500 range. I'm going to refer her to Dr. Hulda MarinJohn Guyton at [facility] for further evaluation and treatment.

## 2016-11-05 NOTE — Assessment & Plan Note (Signed)
History of tobacco abuse currently smoking one quarter of a pack a day on Chantix. Her intent is to stop smoking.

## 2016-11-05 NOTE — Progress Notes (Signed)
11/05/2016 Kathleen Rubio   07/03/1976  [id number]  Primary Physician TODD,JEFFREY [name], MD Primary Cardiologist: Runell GessJonathan J Samyrah Bruster MD Roseanne RenoFACP, FACC, FAHA, FSCAI  HPI:  Kathleen Rubio is a delightful 41 year old thin appearing single Asian female no children was referred by Dr. Tawanna Coolerodd for cardiovascular evaluation because of several risk factors. She does have a history of tobacco abuse having smoked one quarter of a pack a day for 20 years. She currently is on Chantix and is trying to quit. She drinks one to 2 glasses of wine a day. She also has history of hypertension and mixed hyperlipidemia and hypertriglyceridemia on Lopid. There is history of valvular heart disease. She's had echoes in the past although I cannot hear a murmur. She does have an occasional atypical chest pain. She has a high stress job and does admit to having significant anxiety.   Current Outpatient Prescriptions  Medication Sig Dispense Refill  . amitriptyline (ELAVIL) 50 MG tablet Take 1 tablet (50 mg total) by mouth at bedtime. 100 tablet 4  . aspirin-acetaminophen-caffeine (EXCEDRIN MIGRAINE) 250-250-65 MG tablet Take 2 tablets by mouth every 6 (six) hours as needed for headache.    Marland Kitchen. gemfibrozil (LOPID) 600 MG tablet Take 1 tablet (600 mg total) by mouth 2 (two) times daily before a meal. 200 tablet 4  . medroxyPROGESTERone (DEPO-PROVERA) 150 MG/ML injection Inject 1 mL (150 mg total) into the muscle every 3 (three) months. 1 mL 11  . Melatonin ER 5 MG TBCR Take 5 mg by mouth at bedtime. 90 tablet 3  . valACYclovir (VALTREX) 1000 MG tablet TAKE 1 TABLET (1,000 MG TOTAL) BY MOUTH 2 (TWO) TIMES DAILY. 90 tablet 1  . varenicline (CHANTIX CONTINUING MONTH PAK) 1 MG tablet Take 1 tablet (1 mg total) by mouth 2 (two) times daily. 60 tablet 10  . zolpidem (AMBIEN CR) 6.25 MG CR tablet Take 1 tablet (6.25 mg total) by mouth at bedtime as needed. for sleep 100 tablet 4   No current facility-administered medications for this visit.     No  Known Allergies  Social History   Social History  . Marital status: Married    Spouse name: N/A  . Number of children: N/A  . Years of education: N/A   Occupational History  . Not on file.   Social History Main Topics  . Smoking status: Current Every Day Smoker  . Smokeless tobacco: Never Used     Comment: smokes 1pack every 2-4 days  . Alcohol use 2.4 oz/week    4 Standard drinks or equivalent per week     Comment: 2 glasses of wine per week  . Drug use: No  . Sexual activity: Yes    Partners: Male    Birth control/ protection: Injection     Comment: Depo Provera   Other Topics Concern  . Not on file   Social History Narrative  . No narrative on file     Review of Systems: General: negative for chills, fever, night sweats or weight changes.  Cardiovascular: negative for chest pain, dyspnea on exertion, edema, orthopnea, palpitations, paroxysmal nocturnal dyspnea or shortness of breath Dermatological: negative for rash Respiratory: negative for cough or wheezing Urologic: negative for hematuria Abdominal: negative for nausea, vomiting, diarrhea, bright red blood per rectum, melena, or hematemesis Neurologic: negative for visual changes, syncope, or dizziness All other systems reviewed and are otherwise negative except as noted above.    Blood pressure 138/84, pulse 98, height 5\' 4"  (1.626 m),  weight 118 lb (53.5 kg).  General appearance: alert and no distress Neck: no adenopathy, no carotid bruit, no JVD, supple, symmetrical, trachea midline and thyroid not enlarged, symmetric, no tenderness/mass/nodules Lungs: clear to auscultation bilaterally Heart: regular rate and rhythm, S1, S2 normal, no murmur, click, rub or gallop Extremities: extremities normal, atraumatic, no cyanosis or edema  EKG not performed today  ASSESSMENT AND PLAN:   Tobacco use disorder History of tobacco abuse currently smoking one quarter of a pack a day on Chantix. Her intent is to stop  smoking.  Essential hypertension History of essential hypertension blood pressure measured today at 138/84. She is not on antihypertensive medications. She attributes some of her blood pressure issues to anxiety  Hyperlipemia, mixed Kathleen Rubio has hyperlipidemia/hypertriglyceridemia on Lopid which she takes intermittently. Her triglycerides have been up to 900 and her total cholesterol runs in the 3-500 range. I'm going to refer her to Dr. Hulda Marin at [facility] for further evaluation and treatment.      Runell Gess MD FACP,FACC,FAHA, [facility] 11/05/2016 4:16 PM

## 2016-11-12 ENCOUNTER — Telehealth: Payer: Self-pay | Admitting: Cardiovascular Disease

## 2016-11-12 DIAGNOSIS — E7849 Other hyperlipidemia: Secondary | ICD-10-CM

## 2016-11-12 NOTE — Telephone Encounter (Signed)
Contacted Dr. Eligah EastGuyton office-did not receive referral from 2/16.  New referral placed and faxed to [phone]   Attempt to call patient to make aware-no answer, lmtcb.

## 2016-11-12 NOTE — Telephone Encounter (Signed)
Follow Up  Following up on referral. States information may have been sent to the wrong office. Patient is requesting referral be sent to:    Dr. Suzan SlickJohn Guitun at [facility]  [phone] (fax) Attn: Dr. Suzan SlickJohn Guitun

## 2016-11-12 NOTE — Telephone Encounter (Signed)
New Message     Pt called upset that the referral was not sent over to [facility] Hulda MarinJohn [place], per patient you have to physically fax the referral over to their office [phone], they can not received Epic Referrals.

## 2016-11-12 NOTE — Telephone Encounter (Signed)
Returned call to patient-made aware a copy of the referral has been faxed to # provided (see previous telephone note).  Apologized for the delay and advised to call with further questions or concerns.

## 2016-11-23 ENCOUNTER — Ambulatory Visit (INDEPENDENT_AMBULATORY_CARE_PROVIDER_SITE_OTHER): Payer: Managed Care, Other (non HMO)

## 2016-11-23 DIAGNOSIS — Z308 Encounter for other contraceptive management: Secondary | ICD-10-CM | POA: Diagnosis not present

## 2016-11-23 MED ORDER — MEDROXYPROGESTERONE ACETATE 150 MG/ML IM SUSP
150.0000 mg | Freq: Once | INTRAMUSCULAR | Status: AC
  Administered 2016-11-23: 150 mg via INTRAMUSCULAR

## 2016-11-23 NOTE — Progress Notes (Signed)
Patient got her Depo injection for contraceptive on 11/23/2016 at 9 am. Given by Mendel CorningNancy Kigotho CMA.

## 2017-01-04 NOTE — Progress Notes (Signed)
Error in chart

## 2017-03-11 ENCOUNTER — Encounter: Payer: Self-pay | Admitting: Family Medicine

## 2017-03-11 ENCOUNTER — Ambulatory Visit (INDEPENDENT_AMBULATORY_CARE_PROVIDER_SITE_OTHER): Payer: Managed Care, Other (non HMO) | Admitting: Family Medicine

## 2017-03-11 VITALS — BP 142/88 | HR 90 | Temp 98.2°F | Ht 64.0 in | Wt 114.7 lb

## 2017-03-11 DIAGNOSIS — J309 Allergic rhinitis, unspecified: Secondary | ICD-10-CM | POA: Diagnosis not present

## 2017-03-11 DIAGNOSIS — Z720 Tobacco use: Secondary | ICD-10-CM

## 2017-03-11 DIAGNOSIS — H01139 Eczematous dermatitis of unspecified eye, unspecified eyelid: Secondary | ICD-10-CM

## 2017-03-11 DIAGNOSIS — R030 Elevated blood-pressure reading, without diagnosis of hypertension: Secondary | ICD-10-CM | POA: Diagnosis not present

## 2017-03-11 NOTE — Patient Instructions (Addendum)
BEFORE YOU LEAVE: -follow up: 1 month with Dr. Selena BattenKim for elevated BP/allergies  Zyrtec nightly for 1 month  Small amount of hydrocortisone cream 1-2 times daily on lids - do not use in eyes. Available over the counter.  Cool compresses.  Quit smoking - you can!  Limit alcohol.  Healthy diet and regular exercise.  I hope you are feeling better soon! Seek care immediately if worsening, new concerns or you are not improving with treatment.   WE NOW OFFER   [facility] FAST TRACK!!!  SAME DAY Appointments for ACUTE CARE  Such as: Sprains, Injuries, cuts, abrasions, rashes, muscle pain, joint pain, back pain Colds, flu, sore throats, headache, allergies, cough, fever  Ear pain, sinus and eye infections Abdominal pain, nausea, vomiting, diarrhea, upset stomach Animal/insect bites  3 Easy Ways to Schedule: Walk-In Scheduling Call in scheduling Mychart Sign-up: [email or web address]

## 2017-03-11 NOTE — Progress Notes (Signed)
HPI:  Acute visit for ? Allergies: -started: x 1 month -symptoms:nasal congestion, itchy eyes, puffy eyelids sometimes with mild rash, fullness/pooping ears when flies -denies:fever, SOB, NVD, tooth pain, wheezing, hx asthma or allergies, new exposures -has tried: nothing -sick contacts/travel/risks: no reported flu, strep or tick exposure -Hx of: eye lash extensions - not new  Elevated blood pressure/Hyperlipidemia: -reports these are chronic issues -[facility] and reports they plan to add medication for this next week -no CP, SOB, HA  ROS: See pertinent positives and negatives per HPI.  Past Medical History:  Diagnosis Date  . Hyperlipidemia     Past Surgical History:  Procedure Laterality Date  . WISDOM TOOTH EXTRACTION      Family History  Problem Relation Age of Onset  . Breast cancer Mother [id number]       recurrence at age 41  . Cancer Mother 7567       head/neck ca  . Diabetes Father        type 2  . Prostate cancer Father [id number]       currently 7378  . Hyperlipidemia Father   . Breast cancer Maternal Grandmother        Dx 3350s; deceased 8150s    Social History   Social History  . Marital status: Married    Spouse name: N/A  . Number of children: N/A  . Years of education: N/A   Social History Main Topics  . Smoking status: Current Every Day Smoker  . Smokeless tobacco: Never Used     Comment: smokes 1pack every 2-4 days  . Alcohol use 2.4 oz/week    4 Standard drinks or equivalent per week     Comment: 2 glasses of wine per week  . Drug use: No  . Sexual activity: Yes    Partners: Male    Birth control/ protection: Injection     Comment: Depo Provera   Other Topics Concern  . None   Social History Narrative  . None     Current Outpatient Prescriptions:  .  amitriptyline (ELAVIL) 50 MG tablet, Take 1 tablet (50 mg total) by mouth at bedtime., Disp: 100 tablet, Rfl: 4 .  aspirin-acetaminophen-caffeine (EXCEDRIN MIGRAINE) 250-250-65 MG  tablet, Take 2 tablets by mouth every 6 (six) hours as needed for headache., Disp: , Rfl:  .  medroxyPROGESTERone (DEPO-PROVERA) 150 MG/ML injection, Inject 1 mL (150 mg total) into the muscle every 3 (three) months., Disp: 1 mL, Rfl: 11 .  valACYclovir (VALTREX) 1000 MG tablet, TAKE 1 TABLET (1,000 MG TOTAL) BY MOUTH 2 (TWO) TIMES DAILY., Disp: 90 tablet, Rfl: 1 .  varenicline (CHANTIX CONTINUING MONTH PAK) 1 MG tablet, Take 1 tablet (1 mg total) by mouth 2 (two) times daily., Disp: 60 tablet, Rfl: 10 .  zolpidem (AMBIEN CR) 6.25 MG CR tablet, Take 1 tablet (6.25 mg total) by mouth at bedtime as needed. for sleep, Disp: 100 tablet, Rfl: 4 .  gemfibrozil (LOPID) 600 MG tablet, Take 1 tablet (600 mg total) by mouth 2 (two) times daily before a meal. (Patient not taking: Reported on 03/11/2017), Disp: 200 tablet, Rfl: 4  EXAM:  Vitals:   03/11/17 0835  BP: (!) 142/88  Pulse: 90  Temp: 98.2 F (36.8 C)    Body mass index is 19.69 kg/m.  GENERAL: vitals reviewed and listed above, alert, oriented, appears well hydrated and in no acute distress  HEENT: atraumatic, conjunttiva clear, mild edema of eyelids, no obvious abnormalities on inspection  of external nose and ears, normal appearance of ear canals and TMs except for clear effusion bilat, clear nasal congestion, boggy turbinates, mild post oropharyngeal erythema with PND, no tonsillar edema or exudate, no sinus TTP  NECK: no obvious masses on inspection  LUNGS: clear to auscultation bilaterally, no wheezes, rales or rhonchi, good air movement  CV: HRRR, no peripheral edema  MS: moves all extremities without noticeable abnormality  PSYCH: pleasant and cooperative, no obvious depression or anxiety  ASSESSMENT AND PLAN:  Discussed the following assessment and plan:  Allergic rhinitis, unspecified seasonality, unspecified trigger  Eczema of eyelid, unspecified laterality  Elevated blood pressure reading  Tobacco use  -will start  zyrtec at night -discussed lifestyle treatments for HTN/HLD - quit smoking, no alcohol, healthy diet and exercise  -advised to quit smoking and counseled 3-5 minutes - she is on chantix and plans to quit -she thinks specialist will be starting medications for BP and lipids -if not and BP still up at follow up in 1 month will consider starting medication - though she seems reluctant she agrees to consider after discussion risks/benefits -of course, we advised to return or notify a doctor immediately if symptoms worsen or persist or new concerns arise.    Patient Instructions  BEFORE YOU LEAVE: -follow up: 1 month with Dr. Selena Batten for elevated BP/allergies  Zyrtec nightly for 1 month  Small amount of hydrocortisone cream 1-2 times daily on lids - do not use in eyes. Available over the counter.  Cool compresses.  Quit smoking - you can!  Limit alcohol.  Healthy diet and regular exercise.  I hope you are feeling better soon! Seek care immediately if worsening, new concerns or you are not improving with treatment.   WE NOW OFFER   [facility] FAST TRACK!!!  SAME DAY Appointments for ACUTE CARE  Such as: Sprains, Injuries, cuts, abrasions, rashes, muscle pain, joint pain, back pain Colds, flu, sore throats, headache, allergies, cough, fever  Ear pain, sinus and eye infections Abdominal pain, nausea, vomiting, diarrhea, upset stomach Animal/insect bites  3 Easy Ways to Schedule: Walk-In Scheduling Call in scheduling Mychart Sign-up: [email or web address]           Kriste Basque R., DO

## 2017-04-03 ENCOUNTER — Other Ambulatory Visit: Payer: Self-pay | Admitting: Family Medicine

## 2017-04-04 NOTE — Telephone Encounter (Signed)
Dr. Selena BattenKim, you seen pt last.  Ok to restart?

## 2017-04-07 NOTE — Progress Notes (Deleted)
HPI:  Follow up:  Allergies: -see note 03/11/2017 -did trial zyrtec/hct cream -reports: -denies:  HTN/HLD/Tobacco use: -see note 03/11/17 -advised to quit smoking, limit alcohol, lifestyle changes and follow up with specialist at [facility] as she told me they were starting BP/chol meds -reports  ROS: See pertinent positives and negatives per HPI.  Past Medical History:  Diagnosis Date  . Hyperlipidemia     Past Surgical History:  Procedure Laterality Date  . WISDOM TOOTH EXTRACTION      Family History  Problem Relation Age of Onset  . Breast cancer Mother 5652       recurrence at age 958  . Cancer Mother 7167       head/neck ca  . Diabetes Father        type 2  . Prostate cancer Father [phone]       currently 4778  . Hyperlipidemia Father   . Breast cancer Maternal Grandmother        Dx 5150s; deceased 8450s    Social History   Social History  . Marital status: Married    Spouse name: N/A  . Number of children: N/A  . Years of education: N/A   Social History Main Topics  . Smoking status: Current Every Day Smoker  . Smokeless tobacco: Never Used     Comment: smokes 1pack every 2-4 days  . Alcohol use 2.4 oz/week    4 Standard drinks or equivalent per week     Comment: 2 glasses of wine per week  . Drug use: No  . Sexual activity: Yes    Partners: Male    Birth control/ protection: Injection     Comment: Depo Provera   Other Topics Concern  . Not on file   Social History Narrative  . No narrative on file     Current Outpatient Prescriptions:  .  amitriptyline (ELAVIL) 50 MG tablet, Take 1 tablet (50 mg total) by mouth at bedtime., Disp: 100 tablet, Rfl: 4 .  aspirin-acetaminophen-caffeine (EXCEDRIN MIGRAINE) 250-250-65 MG tablet, Take 2 tablets by mouth every 6 (six) hours as needed for headache., Disp: , Rfl:  .  gemfibrozil (LOPID) 600 MG tablet, Take 1 tablet (600 mg total) by mouth 2 (two) times daily before a meal. (Patient not taking: Reported on 03/11/2017),  Disp: 200 tablet, Rfl: 4 .  medroxyPROGESTERone (DEPO-PROVERA) 150 MG/ML injection, Inject 1 mL (150 mg total) into the muscle every 3 (three) months., Disp: 1 mL, Rfl: 11 .  valACYclovir (VALTREX) 1000 MG tablet, TAKE 1 TABLET (1,000 MG TOTAL) BY MOUTH 2 (TWO) TIMES DAILY., Disp: 90 tablet, Rfl: 1 .  varenicline (CHANTIX CONTINUING MONTH PAK) 1 MG tablet, Take 1 tablet (1 mg total) by mouth 2 (two) times daily., Disp: 60 tablet, Rfl: 10 .  zolpidem (AMBIEN CR) 6.25 MG CR tablet, Take 1 tablet (6.25 mg total) by mouth at bedtime as needed. for sleep, Disp: 100 tablet, Rfl: 4  EXAM:  There were no vitals filed for this visit.  There is no height or weight on file to calculate BMI.  GENERAL: vitals reviewed and listed above, alert, oriented, appears well hydrated and in no acute distress  HEENT: atraumatic, conjunttiva clear, no obvious abnormalities on inspection of external nose and ears  NECK: no obvious masses on inspection  LUNGS: clear to auscultation bilaterally, no wheezes, rales or rhonchi, good air movement  CV: HRRR, no peripheral edema  MS: moves all extremities without noticeable abnormality  PSYCH: pleasant and cooperative, no  obvious depression or anxiety  ASSESSMENT AND PLAN:  Discussed the following assessment and plan:  No diagnosis found.  -Patient advised to return or notify a doctor immediately if symptoms worsen or persist or new concerns arise.  There are no Patient Instructions on file for this visit.  Kriste Basque R., DO

## 2017-04-08 ENCOUNTER — Ambulatory Visit: Payer: Managed Care, Other (non HMO) | Admitting: Family Medicine

## 2017-04-08 DIAGNOSIS — Z0289 Encounter for other administrative examinations: Secondary | ICD-10-CM

## 2017-04-15 NOTE — Progress Notes (Signed)
HPI:  Pt of Dr. Tawanna Coolerodd. Here about her BP. Reports elevated BP for many years, never on medications. Mother with HTN. Seeing [facility] specialist for her hyperlipidemia and on fenofibrate. Reports gets regular exercise walking and hiking and eats healthy. Drink 2 glasses of wine several days per week. Smoking 5 cigs per day and wants to quit. Wants chantix starter pack as pharmacy did not have. No CP, SOB, DOE, HA, swelling.  ROS: See pertinent positives and negatives per HPI.  Past Medical History:  Diagnosis Date  . Hyperlipidemia     Past Surgical History:  Procedure Laterality Date  . WISDOM TOOTH EXTRACTION      Family History  Problem Relation Age of Onset  . Breast cancer Mother 9252       recurrence at age 41  . Cancer Mother 7267       head/neck ca  . Diabetes Father        type 2  . Prostate cancer Father [phone]       currently 7178  . Hyperlipidemia Father   . Breast cancer Maternal Grandmother        Dx 7350s; deceased 7750s    Social History   Social History  . Marital status: Married    Spouse name: N/A  . Number of children: N/A  . Years of education: N/A   Social History Main Topics  . Smoking status: Current Every Day Smoker  . Smokeless tobacco: Never Used     Comment: smokes 1pack every 2-4 days  . Alcohol use 2.4 oz/week    4 Standard drinks or equivalent per week     Comment: 2 glasses of wine per week  . Drug use: No  . Sexual activity: Yes    Partners: Male    Birth control/ protection: Injection     Comment: Depo Provera   Other Topics Concern  . None   Social History Narrative  . None     Current Outpatient Prescriptions:  .  amitriptyline (ELAVIL) 50 MG tablet, Take 1 tablet (50 mg total) by mouth at bedtime., Disp: 100 tablet, Rfl: 4 .  aspirin-acetaminophen-caffeine (EXCEDRIN MIGRAINE) 250-250-65 MG tablet, Take 2 tablets by mouth every 6 (six) hours as needed for headache., Disp: , Rfl:  .  medroxyPROGESTERone (DEPO-PROVERA) 150 MG/ML  injection, Inject 1 mL (150 mg total) into the muscle every 3 (three) months., Disp: 1 mL, Rfl: 11 .  valACYclovir (VALTREX) 1000 MG tablet, TAKE 1 TABLET (1,000 MG TOTAL) BY MOUTH 2 (TWO) TIMES DAILY., Disp: 90 tablet, Rfl: 1 .  varenicline (CHANTIX CONTINUING MONTH PAK) 1 MG tablet, Take 1 tablet (1 mg total) by mouth 2 (two) times daily., Disp: 60 tablet, Rfl: 10 .  zolpidem (AMBIEN CR) 6.25 MG CR tablet, Take 1 tablet (6.25 mg total) by mouth at bedtime as needed. for sleep, Disp: 100 tablet, Rfl: 4 .  amLODipine (NORVASC) 5 MG tablet, Take 1 tablet (5 mg total) by mouth daily., Disp: 90 tablet, Rfl: 3 .  varenicline (CHANTIX STARTING MONTH PAK) 0.5 MG X 11 & 1 MG X 42 tablet, Take one 0.5 mg tablet by mouth once daily for 3 days, then increase to one 0.5 mg tablet twice daily for 4 days, then increase to one 1 mg tablet twice daily., Disp: 53 tablet, Rfl: 0  EXAM:  Vitals:   04/18/17 0933  BP: (!) 140/100  Pulse: (!) 114  Temp: 98.2 F (36.8 C)    Body mass index is 19.96  kg/m.  GENERAL: vitals reviewed and listed above, alert, oriented, appears well hydrated and in no acute distress  HEENT: atraumatic, conjunttiva clear, no obvious abnormalities on inspection of external nose and ears  NECK: no obvious masses on inspection  LUNGS: clear to auscultation bilaterally, no wheezes, rales or rhonchi, good air movement  CV: HRRR, no peripheral edema  MS: moves all extremities without noticeable abnormality  PSYCH: pleasant and cooperative, no obvious depression or anxiety  ASSESSMENT AND PLAN:  Discussed the following assessment and plan:  Essential hypertension - Plan: Basic metabolic panel, CBC  Tobacco use disorder  Hyperlipemia, mixed  Alcohol use  -discussed tx options for HTN - opted to try norvasc, labs per orders -continue management of lipids with specialist -smoking cessation counseling 3-5 minutes, chantix rx sent -advised to cut back on alcohol use -  discussed risks -follow up 1 month -Patient advised to return or notify a doctor immediately if symptoms worsen or persist or new concerns arise.  Patient Instructions  BEFORE YOU LEAVE: -follow up: 1 month -labs  Start the Norvasc and take 5 mg each morning.  Cut back on alcohol use.  We have ordered labs or studies at this visit. It can take up to 1-2 weeks for results and processing. IF results require follow up or explanation, we will call you with instructions. Clinically stable results will be released to your [facility]. If you have not heard from us or cannot find your results in [facility] in 2 weeks please contact our office at [phone].  If you are not yet signed up for [facility], please consider signing up.   We recommend the following healthy lifestyle for LIFE: 1) Small portions. Regular healthy meals.  2) Eat a healthy clean diet.   TRY TO EAT: -at least 5-7 servings of low sugar vegetables per day (not corn, potatoes or bananas.) -berries are the best choice if you wish to eat fruit.   -lean meets (fish, chicken or Mal[place]key breasts) -vegan proteins for some meals - beans or tofu, whole grains, nuts and seeds -Replace bad fats with good fats - good fats include: fish, nuts and seeds, canola oil, olive oil -small amounts of low fat or non fat dairy -small amounts of100 % whole grains - check the lables  AVOID: -SUGAR, sweets, anything with added sugar, corn syrup or sweeteners -if you must have a sweetener, small amounts of stevia may be best -sweetened beverages -simple starches (rice, bread, potatoes, pasta, chips, etc - small amounts of 100% whole grains are ok) -red meat, pork, butter -fried foods, fast food, processed food, excessive dairy, eggs and coconut.  3)Get at least 150 minutes of sweaty aerobic exercise per week.  4)Reduce stress - consider counseling, meditation and relaxation to balance other aspects of your life.         Kriste BasqueKIM, Alexzavier Girardin R.,  DO

## 2017-04-18 ENCOUNTER — Encounter: Payer: Self-pay | Admitting: Family Medicine

## 2017-04-18 ENCOUNTER — Ambulatory Visit (INDEPENDENT_AMBULATORY_CARE_PROVIDER_SITE_OTHER): Payer: Managed Care, Other (non HMO) | Admitting: Family Medicine

## 2017-04-18 VITALS — BP 140/100 | HR 114 | Temp 98.2°F | Ht 64.0 in | Wt 116.3 lb

## 2017-04-18 DIAGNOSIS — Z789 Other specified health status: Secondary | ICD-10-CM | POA: Diagnosis not present

## 2017-04-18 DIAGNOSIS — E782 Mixed hyperlipidemia: Secondary | ICD-10-CM | POA: Diagnosis not present

## 2017-04-18 DIAGNOSIS — I10 Essential (primary) hypertension: Secondary | ICD-10-CM

## 2017-04-18 DIAGNOSIS — Z7289 Other problems related to lifestyle: Secondary | ICD-10-CM

## 2017-04-18 DIAGNOSIS — F17200 Nicotine dependence, unspecified, uncomplicated: Secondary | ICD-10-CM | POA: Diagnosis not present

## 2017-04-18 MED ORDER — AMLODIPINE BESYLATE 5 MG PO TABS: 5.0000 mg | ORAL_TABLET | Freq: Every day | ORAL | 3 refills | Status: DC

## 2017-04-18 MED ORDER — VARENICLINE TARTRATE 0.5 MG X 11 & 1 MG X 42 PO MISC: ORAL | 0 refills | Status: DC

## 2017-04-18 NOTE — Patient Instructions (Signed)
BEFORE YOU LEAVE: -follow up: 1 month -labs  Start the Norvasc and take 5 mg each morning.  Cut back on alcohol use.  We have ordered labs or studies at this visit. It can take up to 1-2 weeks for results and processing. IF results require follow up or explanation, we will call you with instructions. Clinically stable results will be released to your [facility]. If you have not heard from us or cannot find your results in [facility] in 2 weeks please contact our office at [phone].  If you are not yet signed up for [facility], please consider signing up.   We recommend the following healthy lifestyle for LIFE: 1) Small portions. Regular healthy meals.  2) Eat a healthy clean diet.   TRY TO EAT: -at least 5-7 servings of low sugar vegetables per day (not corn, potatoes or bananas.) -berries are the best choice if you wish to eat fruit.   -lean meets (fish, chicken or [place] breasts) -vegan proteins for some meals - beans or tofu, whole grains, nuts and seeds -Replace bad fats with good fats - good fats include: fish, nuts and seeds, canola oil, olive oil -small amounts of low fat or non fat dairy -small amounts of100 % whole grains - check the lables  AVOID: -SUGAR, sweets, anything with added sugar, corn syrup or sweeteners -if you must have a sweetener, small amounts of stevia may be best -sweetened beverages -simple starches (rice, bread, potatoes, pasta, chips, etc - small amounts of 100% whole grains are ok) -red meat, pork, butter -fried foods, fast food, processed food, excessive dairy, eggs and coconut.  3)Get at least 150 minutes of sweaty aerobic exercise per week.  4)Reduce stress - consider counseling, meditation and relaxation to balance other aspects of your life.

## 2017-04-21 ENCOUNTER — Encounter: Payer: Self-pay | Admitting: Adult Health

## 2017-04-21 ENCOUNTER — Ambulatory Visit (INDEPENDENT_AMBULATORY_CARE_PROVIDER_SITE_OTHER): Payer: Managed Care, Other (non HMO) | Admitting: Adult Health

## 2017-04-21 VITALS — BP 134/80 | Temp 98.2°F | Wt 117.0 lb

## 2017-04-21 DIAGNOSIS — T63461A Toxic effect of venom of wasps, accidental (unintentional), initial encounter: Secondary | ICD-10-CM | POA: Diagnosis not present

## 2017-04-21 MED ORDER — PREDNISONE 20 MG PO TABS
ORAL_TABLET | ORAL | 0 refills | Status: DC
Start: 2017-04-21 — End: 2018-02-01

## 2017-04-21 NOTE — Progress Notes (Signed)
Subjective:    Patient ID: Kathleen Rubio, female    DOB: 03/25/1976, 41 y.o.   MRN: [id number]  HPI  41 year old female who  has a past medical history of Hyperlipidemia. She is a patient of Dr. Tawanna Coolerodd who I am seeing today for an acute issue of wasp sting to bilateral feet. She reports that this incident happened 2 day go while she was working in the yard. She was stung by multiple wasps.   Reports that the swelling and redness to bilateral feet have become worse and she continues to experience pain    Review of Systems See HPI    Past Medical History:  Diagnosis Date  . Hyperlipidemia     Social History   Social History  . Marital status: Married    Spouse name: N/A  . Number of children: N/A  . Years of education: N/A   Occupational History  . Not on file.   Social History Main Topics  . Smoking status: Current Every Day Smoker  . Smokeless tobacco: Never Used     Comment: smokes 1pack every 2-4 days  . Alcohol use 2.4 oz/week    4 Standard drinks or equivalent per week     Comment: 2 glasses of wine per week  . Drug use: No  . Sexual activity: Yes    Partners: Male    Birth control/ protection: Injection     Comment: Depo Provera   Other Topics Concern  . Not on file   Social History Narrative  . No narrative on file    Past Surgical History:  Procedure Laterality Date  . WISDOM TOOTH EXTRACTION      Family History  Problem Relation Age of Onset  . Breast cancer Mother 6852       recurrence at age 41  . Cancer Mother 6967       head/neck ca  . Diabetes Father        type 2  . Prostate cancer Father [phone]       currently 6978  . Hyperlipidemia Father   . Breast cancer Maternal Grandmother        Dx 5950s; deceased 2850s    No Known Allergies  Current Outpatient Prescriptions on File Prior to Visit  Medication Sig Dispense Refill  . amitriptyline (ELAVIL) 50 MG tablet Take 1 tablet (50 mg total) by mouth at bedtime. 100 tablet 4  . amLODipine (NORVASC) 5 MG  tablet Take 1 tablet (5 mg total) by mouth daily. 90 tablet 3  . aspirin-acetaminophen-caffeine (EXCEDRIN MIGRAINE) 250-250-65 MG tablet Take 2 tablets by mouth every 6 (six) hours as needed for headache.    . medroxyPROGESTERone (DEPO-PROVERA) 150 MG/ML injection Inject 1 mL (150 mg total) into the muscle every 3 (three) months. 1 mL 11  . valACYclovir (VALTREX) 1000 MG tablet TAKE 1 TABLET (1,000 MG TOTAL) BY MOUTH 2 (TWO) TIMES DAILY. 90 tablet 1  . zolpidem (AMBIEN CR) 6.25 MG CR tablet Take 1 tablet (6.25 mg total) by mouth at bedtime as needed. for sleep 100 tablet 4  . varenicline (CHANTIX CONTINUING MONTH PAK) 1 MG tablet Take 1 tablet (1 mg total) by mouth 2 (two) times daily. (Patient not taking: Reported on 04/21/2017) 60 tablet 10  . varenicline (CHANTIX STARTING MONTH PAK) 0.5 MG X 11 & 1 MG X 42 tablet Take one 0.5 mg tablet by mouth once daily for 3 days, then increase to one 0.5 mg tablet twice daily for  4 days, then increase to one 1 mg tablet twice daily. (Patient not taking: Reported on 04/21/2017) 53 tablet 0   No current facility-administered medications on file prior to visit.     BP 134/80 (BP Location: Left Arm)   Temp 98.2 F (36.8 C) (Oral)   Wt 117 lb (53.1 kg)   BMI 20.08 kg/m       Objective:   Physical Exam  Constitutional: She is oriented to person, place, and time. She appears well-developed and well-nourished.  Musculoskeletal: Normal range of motion. She exhibits edema (bilateral feet and ankles ).  Neurological: She is alert and oriented to person, place, and time.  Skin: Rash (red rash to maculopapular rash to bilateral lower extremities ) noted. She is not diaphoretic.  Psychiatric: She has a normal mood and affect. Her behavior is normal. Judgment and thought content normal.  Nursing note and vitals reviewed.      Assessment & Plan:  1. Wasp sting, accidental or unintentional, initial encounter - predniSONE (DELTASONE) 20 MG tablet; 40 mg today and  20 mg the next 4 days  Dispense: 6 tablet; Refill: 0 - Follow up if no improvement in the next 24 hours  - Can take Benadryl as needed. Ice and elevate legs   Shirline Freesory Geoffrey Hynes, NP

## 2017-05-03 ENCOUNTER — Telehealth: Payer: Self-pay | Admitting: *Deleted

## 2017-05-03 NOTE — Telephone Encounter (Signed)
-----   Message from Terressa KoyanagiHannah R Kim, DO sent at 04/27/2017  8:36 PM EDT ----- I think she was supposed to have labs last visit. If scheduled for appt in a few weeks we can do them then - if not can you please schedule follow up in late august? Thanks ----- Message ----- From: SYSTEM Sent: 04/23/2017  12:06 AM To: Terressa KoyanagiHannah R Kim, DO

## 2017-05-03 NOTE — Telephone Encounter (Signed)
I left a message for the pt to return my call. 

## 2017-05-06 NOTE — Telephone Encounter (Signed)
Pt has been scheduled 8/24 at 8:45 am.

## 2017-05-06 NOTE — Telephone Encounter (Signed)
Noted  

## 2017-05-13 ENCOUNTER — Ambulatory Visit: Payer: Managed Care, Other (non HMO) | Admitting: Family Medicine

## 2017-05-28 ENCOUNTER — Other Ambulatory Visit: Payer: Self-pay | Admitting: Family Medicine

## 2017-06-01 ENCOUNTER — Other Ambulatory Visit: Payer: Self-pay | Admitting: Family Medicine

## 2017-06-01 NOTE — Telephone Encounter (Signed)
Pt is calling to check the status of the Rx zolpidem  Pharm:  [facility]

## 2017-06-01 NOTE — Telephone Encounter (Signed)
Rx was signed and pt is aware to pick it up at the front desk.

## 2017-06-01 NOTE — Telephone Encounter (Signed)
Please Advice 

## 2017-06-02 NOTE — Telephone Encounter (Signed)
Rx was printed, signed and pt picked it up on 06/01/2017.

## 2017-06-02 NOTE — Telephone Encounter (Signed)
Pt picked up the Rx.

## 2017-06-10 ENCOUNTER — Telehealth: Payer: Self-pay

## 2017-06-10 ENCOUNTER — Encounter: Payer: Self-pay | Admitting: Family Medicine

## 2017-06-10 ENCOUNTER — Ambulatory Visit (INDEPENDENT_AMBULATORY_CARE_PROVIDER_SITE_OTHER): Payer: Managed Care, Other (non HMO)

## 2017-06-10 DIAGNOSIS — Z308 Encounter for other contraceptive management: Secondary | ICD-10-CM

## 2017-06-10 DIAGNOSIS — Z23 Encounter for immunization: Secondary | ICD-10-CM

## 2017-06-10 MED ORDER — MEDROXYPROGESTERONE ACETATE 150 MG/ML IM SUSP
150.0000 mg | Freq: Once | INTRAMUSCULAR | Status: AC
  Administered 2017-06-10: 150 mg via INTRAMUSCULAR

## 2017-06-10 NOTE — Telephone Encounter (Signed)
Pt came in for her Depo Provera this morning, patient was past due on her depo provera in June and was late.A pregnancy test was taken and was Negative. Pt received her Depo provera and a flu vaccine.

## 2017-07-12 ENCOUNTER — Emergency Department (HOSPITAL_BASED_OUTPATIENT_CLINIC_OR_DEPARTMENT_OTHER): Payer: Managed Care, Other (non HMO)

## 2017-07-12 ENCOUNTER — Emergency Department (HOSPITAL_BASED_OUTPATIENT_CLINIC_OR_DEPARTMENT_OTHER)
Admission: EM | Admit: 2017-07-12 | Discharge: 2017-07-12 | Disposition: A | Discharge status: Home / Self Care | Payer: Managed Care, Other (non HMO) | Attending: Emergency Medicine | Admitting: Emergency Medicine

## 2017-07-12 ENCOUNTER — Encounter (HOSPITAL_BASED_OUTPATIENT_CLINIC_OR_DEPARTMENT_OTHER): Payer: Self-pay | Admitting: *Deleted

## 2017-07-12 DIAGNOSIS — J90 Pleural effusion, not elsewhere classified: Secondary | ICD-10-CM | POA: Insufficient documentation

## 2017-07-12 DIAGNOSIS — Y929 Unspecified place or not applicable: Secondary | ICD-10-CM | POA: Insufficient documentation

## 2017-07-12 DIAGNOSIS — F17200 Nicotine dependence, unspecified, uncomplicated: Secondary | ICD-10-CM | POA: Diagnosis not present

## 2017-07-12 DIAGNOSIS — R0781 Pleurodynia: Secondary | ICD-10-CM

## 2017-07-12 DIAGNOSIS — S2242XA Multiple fractures of ribs, left side, initial encounter for closed fracture: Secondary | ICD-10-CM | POA: Insufficient documentation

## 2017-07-12 DIAGNOSIS — Y999 Unspecified external cause status: Secondary | ICD-10-CM | POA: Diagnosis not present

## 2017-07-12 DIAGNOSIS — Z79899 Other long term (current) drug therapy: Secondary | ICD-10-CM | POA: Diagnosis not present

## 2017-07-12 DIAGNOSIS — S299XXA Unspecified injury of thorax, initial encounter: Secondary | ICD-10-CM | POA: Diagnosis present

## 2017-07-12 DIAGNOSIS — W108XXA Fall (on) (from) other stairs and steps, initial encounter: Secondary | ICD-10-CM | POA: Diagnosis not present

## 2017-07-12 DIAGNOSIS — Y9301 Activity, walking, marching and hiking: Secondary | ICD-10-CM | POA: Insufficient documentation

## 2017-07-12 DIAGNOSIS — I10 Essential (primary) hypertension: Secondary | ICD-10-CM | POA: Diagnosis not present

## 2017-07-12 HISTORY — DX: Essential (primary) hypertension: I10

## 2017-07-12 MED ORDER — HYDROCODONE-ACETAMINOPHEN 5-325 MG PO TABS: 1.0000 | ORAL_TABLET | Freq: Four times a day (QID) | ORAL | 0 refills | Status: DC | PRN

## 2017-07-12 NOTE — ED Provider Notes (Signed)
[facility] EMERGENCY DEPARTMENT Provider Note   CSN: [id number] Arrival date & time: 07/12/17  1911     History   Chief Complaint Chief Complaint  Patient presents with  . Fall    HPI Kathleen Rubio is a 41 y.o. female who presents with a fall and left sided rib/back pain.  She states she was walking down the stairs at about 5 PM today and was wearing socks and slipped and fell backwards onto her back.  She denies any head injury or neck pain.  She is denies any arm or leg weakness has been able to ambulate without difficulty.  She reports left-sided rib and mid back pain.  She states it feels like "cartilage is crunching".  She took Aleve with mild relief of pain. She denies any shortness of breath but has pain with inspiration. She is not on blood thinners.  HPI  Past Medical History:  Diagnosis Date  . Hyperlipidemia   . Hypertension     Patient Active Problem List   Diagnosis Date Noted  . Essential hypertension 11/05/2016  . Routine general medical examination at a health care facility 10/18/2016  . Hyperlipemia, mixed 05/20/2016  . Contraceptive management 05/20/2016  . Tobacco use disorder 05/20/2016  . Urticaria of unknown origin 05/20/2016  . Hypertriglyceridemia 10/13/2015    Past Surgical History:  Procedure Laterality Date  . WISDOM TOOTH EXTRACTION      OB History    Gravida Para Term Preterm AB Living   0 0 0 0 0 0   SAB TAB Ectopic Multiple Live Births   0 0 0 0         Home Medications    Prior to Admission medications   Medication Sig Start Date End Date Taking? Authorizing Provider  amitriptyline (ELAVIL) 50 MG tablet Take 1 tablet (50 mg total) by mouth at bedtime. 10/18/16   Roderick Pee, MD  amLODipine (NORVASC) 5 MG tablet Take 1 tablet (5 mg total) by mouth daily. 04/18/17   Terressa Koyanagi, DO  aspirin-acetaminophen-caffeine (EXCEDRIN MIGRAINE) [phone] MG tablet Take 2 tablets by mouth every 6 (six) hours as needed for headache.     [provider]  medroxyPROGESTERone (DEPO-PROVERA) 150 MG/ML injection Inject 1 mL (150 mg total) into the muscle every 3 (three) months. 05/20/16   [place], Betty G, MD  predniSONE (DELTASONE) 20 MG tablet 40 mg today and 20 mg the next 4 days 04/21/17   Shirline Frees, NP  valACYclovir (VALTREX) 1000 MG tablet TAKE 1 TABLET (1,000 MG TOTAL) BY MOUTH 2 (TWO) TIMES DAILY. 10/25/14   Roderick Pee, MD  varenicline (CHANTIX CONTINUING MONTH PAK) 1 MG tablet Take 1 tablet (1 mg total) by mouth 2 (two) times daily. Patient not taking: Reported on 04/21/2017 10/18/16   Roderick Pee, MD  varenicline (CHANTIX STARTING MONTH PAK) 0.5 MG X 11 & 1 MG X 42 tablet Take one 0.5 mg tablet by mouth once daily for 3 days, then increase to one 0.5 mg tablet twice daily for 4 days, then increase to one 1 mg tablet twice daily. Patient not taking: Reported on 04/21/2017 04/18/17   Terressa Koyanagi, DO  zolpidem (AMBIEN CR) 6.25 MG CR tablet TAKE 1 TABLET AT BEDTIME AS NEEDED FOR SLEEP 06/02/17   Roderick Pee, MD    Family History Family History  Problem Relation Age of Onset  . Breast cancer Mother 29       recurrence at age 31  .  Cancer Mother 1567       head/neck ca  . Diabetes Father        type 2  . Prostate cancer Father [id number]       currently 7178  . Hyperlipidemia Father   . Breast cancer Maternal Grandmother        Dx 5350s; deceased 2650s    Social History Social History  Substance Use Topics  . Smoking status: Current Every Day Smoker  . Smokeless tobacco: Never Used     Comment: smokes 1pack every 2-4 days  . Alcohol use 2.4 oz/week    4 Standard drinks or equivalent per week     Comment: 2 glasses of wine per week     Allergies   Patient has no known allergies.   Review of Systems Review of Systems  Respiratory: Negative for shortness of breath.   Cardiovascular:       +rib pain   Musculoskeletal: Positive for back pain. Negative for gait problem.  Neurological: Negative for  weakness and numbness.     Physical Exam Updated Vital Signs BP (!) 143/94 (BP Location: Left Arm)   Pulse (!) 109   Temp 98.6 F (37 C) (Oral)   Resp 18   Ht 5\' 5"  (1.651 m)   Wt 49.9 kg (110 lb)   SpO2 100%   BMI 18.30 kg/m   Physical Exam  Constitutional: She is oriented to person, place, and time. She appears well-developed and well-nourished. No distress.  HENT:  Head: Normocephalic and atraumatic.  Eyes: Pupils are equal, round, and reactive to light. Conjunctivae are normal. Right eye exhibits no discharge. Left eye exhibits no discharge. No scleral icterus.  Neck: Normal range of motion.  Cardiovascular: Normal rate and regular rhythm.  Exam reveals no gallop and no friction rub.   No murmur heard. Pulmonary/Chest: Effort normal and breath sounds normal. No respiratory distress. She has no wheezes. She has no rales. She exhibits tenderness (left lateral lower rib tenderness).  Abdominal: She exhibits no distension.  Musculoskeletal:  No midline tenderness. Erythema over left lateral back which is swollen and tender to palpation  Neurological: She is alert and oriented to person, place, and time.  Skin: Skin is warm and dry.  Psychiatric: She has a normal mood and affect. Her behavior is normal.  Nursing note and vitals reviewed.    ED Treatments / Results  Labs (all labs ordered are listed, but only abnormal results are displayed) Labs Reviewed - No data to display  EKG  EKG Interpretation None       Radiology Dg Ribs Unilateral W/chest Left  Result Date: 07/12/2017 CLINICAL DATA:  41 year old female with fall and left posterior rib pain. EXAM: LEFT RIBS AND CHEST - 3+ VIEW COMPARISON:  None. FINDINGS: There is a small left pleural effusion. There is associated minimal atelectatic changes of the left lung base. The right lung is clear. No pneumothorax. The cardiac silhouette is within normal limits. There are mildly displaced fractures of the left posterior  eighth and ninth ribs. IMPRESSION: 1. Mildly displaced fractures of the left posterior eighth and ninth ribs. 2. Small left pleural effusion.  No pneumothorax. Electronically Signed   By: Elgie CollardArash  Radparvar M.D.   On: 07/12/2017 20:46    Procedures Procedures (including critical care time)  Medications Ordered in ED Medications - No data to display   Initial Impression / Assessment and Plan / ED Course  I have reviewed the triage vital signs and the nursing  notes.  Pertinent labs & imaging results that were available during my care of the patient were reviewed by me and considered in my medical decision making (see chart for details).  41 year old female presents with back/rib pain after a mechanical fall. She is hypertensive but otherwise vitals are normal. She has 8th and 9th left sided rib fractures. She also has a small pleural effusion. Will provide pain control and give her an incentive spirometer. Advised f/u with PCP. Return precautions given.  Final Clinical Impressions(s) / ED Diagnoses   Final diagnoses:  Rib pain  Closed fracture of multiple ribs of left side, initial encounter  Pleural effusion on left    New Prescriptions New Prescriptions   No medications on file     Bethel Born, PA-C 07/12/17 2250    Lavera Guise, MD 07/14/17 [phone]

## 2017-07-12 NOTE — ED Notes (Signed)
Patient transported to X-ray 

## 2017-07-12 NOTE — Discharge Instructions (Signed)
Take Naproxen twice daily for pain/inflammation Take Norco as needed for severe pain Use incentive spirometer several times a day Follow up with your doctor

## 2017-07-12 NOTE — ED Triage Notes (Signed)
She slipped down stairs. Pain in her mid back.

## 2017-07-15 ENCOUNTER — Encounter: Payer: Self-pay | Admitting: Family Medicine

## 2017-07-15 ENCOUNTER — Ambulatory Visit (INDEPENDENT_AMBULATORY_CARE_PROVIDER_SITE_OTHER): Payer: Managed Care, Other (non HMO) | Admitting: Family Medicine

## 2017-07-15 VITALS — BP 118/82 | HR 100 | Temp 98.4°F | Ht 65.0 in | Wt 121.1 lb

## 2017-07-15 DIAGNOSIS — I10 Essential (primary) hypertension: Secondary | ICD-10-CM

## 2017-07-15 DIAGNOSIS — S2242XA Multiple fractures of ribs, left side, initial encounter for closed fracture: Secondary | ICD-10-CM | POA: Diagnosis not present

## 2017-07-15 NOTE — Patient Instructions (Addendum)
Schedule 30 minute follow-up/NPV with Dr. Selena BattenKim or another provider in 3 months  Continue the incentive spirometry  Seek care immediately if any fevers, shortness of breath, persistent cough or any other concerns

## 2017-07-15 NOTE — Progress Notes (Signed)
HPI:  Follow up rib fx: -s/p fall when slipped on steps in socks -went to ER 10/23 and dx with mildly displaced fxs of the L posterior 8-9th ribs -treated with pain medication and incentive spirometry -BP was elevated and was advised to follow up with PCP -reports: feeling much better, pain is stable, and has not needed pain medication, no shortness of breath, cough, fever, wheezing or hemoptysis -Using the incentive spirometry   ROS: See pertinent positives and negatives per HPI.  Past Medical History:  Diagnosis Date  . Hyperlipidemia   . Hypertension     Past Surgical History:  Procedure Laterality Date  . WISDOM TOOTH EXTRACTION      Family History  Problem Relation Age of Onset  . Breast cancer Mother 78       recurrence at age 52  . Cancer Mother 33       head/neck ca  . Diabetes Father        type 2  . Prostate cancer Father 62       currently 67  . Hyperlipidemia Father   . Breast cancer Maternal Grandmother        Dx 42s; deceased 56s    Social History   Social History  . Marital status: Married    Spouse name: N/A  . Number of children: N/A  . Years of education: N/A   Social History Main Topics  . Smoking status: Current Every Day Smoker  . Smokeless tobacco: Never Used     Comment: smokes 1pack every 2-4 days  . Alcohol use 2.4 oz/week    4 Standard drinks or equivalent per week     Comment: 2 glasses of wine per week  . Drug use: No  . Sexual activity: Yes    Partners: Male    Birth control/ protection: Injection     Comment: Depo Provera   Other Topics Concern  . None   Social History Narrative  . None     Current Outpatient Prescriptions:  .  amitriptyline (ELAVIL) 50 MG tablet, Take 1 tablet (50 mg total) by mouth at bedtime., Disp: 100 tablet, Rfl: 4 .  amLODipine (NORVASC) 5 MG tablet, Take 1 tablet (5 mg total) by mouth daily., Disp: 90 tablet, Rfl: 3 .  aspirin-acetaminophen-caffeine (EXCEDRIN MIGRAINE) 250-250-65 MG tablet,  Take 2 tablets by mouth every 6 (six) hours as needed for headache., Disp: , Rfl:  .  HYDROcodone-acetaminophen (NORCO) 5-325 MG tablet, Take 1 tablet by mouth every 6 (six) hours as needed for moderate pain., Disp: 10 tablet, Rfl: 0 .  medroxyPROGESTERone (DEPO-PROVERA) 150 MG/ML injection, Inject 1 mL (150 mg total) into the muscle every 3 (three) months., Disp: 1 mL, Rfl: 11 .  predniSONE (DELTASONE) 20 MG tablet, 40 mg today and 20 mg the next 4 days, Disp: 6 tablet, Rfl: 0 .  valACYclovir (VALTREX) 1000 MG tablet, TAKE 1 TABLET (1,000 MG TOTAL) BY MOUTH 2 (TWO) TIMES DAILY., Disp: 90 tablet, Rfl: 1 .  varenicline (CHANTIX CONTINUING MONTH PAK) 1 MG tablet, Take 1 tablet (1 mg total) by mouth 2 (two) times daily., Disp: 60 tablet, Rfl: 10 .  varenicline (CHANTIX STARTING MONTH PAK) 0.5 MG X 11 & 1 MG X 42 tablet, Take one 0.5 mg tablet by mouth once daily for 3 days, then increase to one 0.5 mg tablet twice daily for 4 days, then increase to one 1 mg tablet twice daily., Disp: 53 tablet, Rfl: 0 .  zolpidem (AMBIEN CR)  6.25 MG CR tablet, TAKE 1 TABLET AT BEDTIME AS NEEDED FOR SLEEP, Disp: 100 tablet, Rfl: 0  EXAM:  Vitals:   07/15/17 1341  BP: 118/82  Pulse: 100  Temp: 98.4 F (36.9 C)  SpO2: 98%    Body mass index is 20.15 kg/m.  GENERAL: vitals reviewed and listed above, alert, oriented, appears well hydrated and in no acute distress  HEENT: atraumatic, conjunttiva clear, no obvious abnormalities on inspection of external nose and ears  NECK: no obvious masses on inspection  LUNGS: clear to auscultation bilaterally, no wheezes, rales or rhonchi, good air movement to both bases  CV: HRRR, no peripheral edema  MS: moves all extremities without noticeable abnormality, Perry mild bruising in the mid to lower posterior rib cage region over the eighth rib, some mild tenderness to palpation here  PSYCH: pleasant and cooperative, no obvious depression or anxiety  ASSESSMENT AND  PLAN:  Discussed the following assessment and plan:  Closed fracture of multiple ribs of left side, initial encounter  Essential hypertension  -seems to be doing remarkably well -Continue current management, discussed risks with opioid pain medications and happy she is not taking this -She planned to transition care to me, I did let her know that I do not prescribe controlled substances including ambien and she is going to consider seeing me versus continuing with her PCP -Patient advised to return or notify a doctor immediately if symptoms worsen or persist or new concerns arise.  Patient Instructions  Schedule 30 minute follow-up/NPV with Dr. Selena BattenKim or another provider in 3 months  Continue the incentive spirometry  Seek care immediately if any fevers, shortness of breath, persistent cough or any other concerns   Kriste BasqueKIM, Excell Neyland R., DO

## 2017-07-25 ENCOUNTER — Ambulatory Visit: Payer: Managed Care, Other (non HMO) | Admitting: Family Medicine

## 2017-09-05 ENCOUNTER — Ambulatory Visit: Payer: Managed Care, Other (non HMO)

## 2017-10-04 ENCOUNTER — Other Ambulatory Visit: Payer: Self-pay | Admitting: Family Medicine

## 2017-10-04 ENCOUNTER — Telehealth: Payer: Self-pay

## 2017-10-04 NOTE — Telephone Encounter (Signed)
Pt Rx for Zolpidem phoned in to the pt pharmacy

## 2017-10-19 ENCOUNTER — Telehealth: Payer: Self-pay | Admitting: Family Medicine

## 2017-10-19 NOTE — Telephone Encounter (Signed)
Copied from CRM [id number]. Topic: General - Other >> Oct 19, 2017  3:48 PM Stephannie LiSimmons, Janett L, NT wrote: Reason for CRM: Patient would like a physical  and she does not have one listed and  she says she would stay with Dr Tawanna Coolerodd please advise [phone]   Can we schedule this? Dr. Tawanna Coolerodd is no longer listed as the PCP.

## 2017-10-21 ENCOUNTER — Ambulatory Visit: Payer: Self-pay

## 2017-10-24 NOTE — Telephone Encounter (Signed)
Call pt left a message on pt phone to return my call in the office regarding her request for CPE appointment.

## 2017-10-27 NOTE — Telephone Encounter (Signed)
A letter has been mailed out notifying pt to call our office to schedule an appointment for a physical due to no success in calling pt.

## 2017-10-30 ENCOUNTER — Other Ambulatory Visit: Payer: Self-pay | Admitting: Family Medicine

## 2017-11-04 ENCOUNTER — Telehealth: Payer: Self-pay

## 2017-11-04 ENCOUNTER — Ambulatory Visit: Payer: Self-pay

## 2017-11-04 NOTE — Telephone Encounter (Signed)
Called pt regarding her missed Depo- Provera  injection appointment this morning, requested pt to return the call in the office if any need to reschedule.

## 2017-12-05 ENCOUNTER — Other Ambulatory Visit: Payer: Self-pay | Admitting: Family Medicine

## 2017-12-05 DIAGNOSIS — Z1231 Encounter for screening mammogram for malignant neoplasm of breast: Secondary | ICD-10-CM

## 2017-12-08 ENCOUNTER — Other Ambulatory Visit: Payer: Self-pay | Admitting: Family Medicine

## 2017-12-09 NOTE — Telephone Encounter (Signed)
Called pt to notify her that Dr Tawanna Coolerodd is out of the office until Monday, left a message for pt to return my call in the office.

## 2017-12-09 NOTE — Telephone Encounter (Signed)
Called pt, left a message to call the office and schedule an appointment for an office visit/medication refill per Dr Tawanna Coolerodd.

## 2017-12-20 ENCOUNTER — Ambulatory Visit: Payer: Self-pay

## 2017-12-27 ENCOUNTER — Encounter: Payer: Self-pay | Admitting: Family Medicine

## 2017-12-27 DIAGNOSIS — Z0289 Encounter for other administrative examinations: Secondary | ICD-10-CM

## 2017-12-29 ENCOUNTER — Inpatient Hospital Stay: Admission: RE | Admit: 2017-12-29 | Payer: Self-pay | Source: Ambulatory Visit

## 2018-02-01 ENCOUNTER — Ambulatory Visit (INDEPENDENT_AMBULATORY_CARE_PROVIDER_SITE_OTHER): Payer: Managed Care, Other (non HMO) | Admitting: Family Medicine

## 2018-02-01 ENCOUNTER — Encounter: Payer: Self-pay | Admitting: Family Medicine

## 2018-02-01 ENCOUNTER — Other Ambulatory Visit (HOSPITAL_COMMUNITY)
Admission: RE | Admit: 2018-02-01 | Discharge: 2018-02-01 | Disposition: A | Discharge status: Home / Self Care | Payer: Managed Care, Other (non HMO) | Source: Ambulatory Visit | Attending: Family Medicine | Admitting: Family Medicine

## 2018-02-01 VITALS — BP 114/76 | HR 102 | Temp 98.3°F | Ht 65.0 in | Wt 116.0 lb

## 2018-02-01 DIAGNOSIS — I10 Essential (primary) hypertension: Secondary | ICD-10-CM | POA: Diagnosis not present

## 2018-02-01 DIAGNOSIS — Z124 Encounter for screening for malignant neoplasm of cervix: Secondary | ICD-10-CM

## 2018-02-01 DIAGNOSIS — T63461A Toxic effect of venom of wasps, accidental (unintentional), initial encounter: Secondary | ICD-10-CM | POA: Diagnosis not present

## 2018-02-01 DIAGNOSIS — L509 Urticaria, unspecified: Secondary | ICD-10-CM

## 2018-02-01 DIAGNOSIS — Z0000 Encounter for general adult medical examination without abnormal findings: Secondary | ICD-10-CM

## 2018-02-01 DIAGNOSIS — N926 Irregular menstruation, unspecified: Secondary | ICD-10-CM

## 2018-02-01 DIAGNOSIS — F17200 Nicotine dependence, unspecified, uncomplicated: Secondary | ICD-10-CM

## 2018-02-01 DIAGNOSIS — M25552 Pain in left hip: Secondary | ICD-10-CM | POA: Insufficient documentation

## 2018-02-01 DIAGNOSIS — Z30019 Encounter for initial prescription of contraceptives, unspecified: Secondary | ICD-10-CM | POA: Diagnosis not present

## 2018-02-01 LAB — CBC WITH DIFFERENTIAL/PLATELET
Basophils Absolute: 0 10*3/uL (ref 0.0–0.1)
Basophils Relative: 0.8 % (ref 0.0–3.0)
Eosinophils Absolute: 0.1 10*3/uL (ref 0.0–0.7)
Eosinophils Relative: 1.7 % (ref 0.0–5.0)
HCT: 39.1 % (ref 36.0–46.0)
Hemoglobin: 13.6 g/dL (ref 12.0–15.0)
Lymphocytes Relative: 30.6 % (ref 12.0–46.0)
Lymphs Abs: 1.9 10*3/uL (ref 0.7–4.0)
MCHC: 34.8 g/dL (ref 30.0–36.0)
MCV: 102.8 fl — ABNORMAL HIGH (ref 78.0–100.0)
Monocytes Absolute: 0.4 10*3/uL (ref 0.1–1.0)
Monocytes Relative: 6 % (ref 3.0–12.0)
Neutro Abs: 3.8 10*3/uL (ref 1.4–7.7)
Neutrophils Relative %: 60.9 % (ref 43.0–77.0)
Platelets: 360 10*3/uL (ref 150.0–400.0)
RBC: 3.81 Mil/uL — ABNORMAL LOW (ref 3.87–5.11)
RDW: 13 % (ref 11.5–15.5)
WBC: 6.2 10*3/uL (ref 4.0–10.5)

## 2018-02-01 LAB — LIPID PANEL
Cholesterol: 296 mg/dL — ABNORMAL HIGH (ref 0–200)
HDL: 118.8 mg/dL (ref >39.00)
Total CHOL/HDL Ratio: 2
Triglycerides: 529 mg/dL — ABNORMAL HIGH (ref 0.0–149.0)

## 2018-02-01 LAB — POCT URINE PREGNANCY: Preg Test, Ur: NEGATIVE

## 2018-02-01 LAB — HEPATIC FUNCTION PANEL
ALT: 8 U/L (ref 0–35)
AST: 12 U/L (ref 0–37)
Albumin: 4.3 g/dL (ref 3.5–5.2)
Alkaline Phosphatase: 47 U/L (ref 39–117)
Bilirubin, Direct: 0.1 mg/dL (ref 0.0–0.3)
Total Bilirubin: 0.5 mg/dL (ref 0.2–1.2)
Total Protein: 7.1 g/dL (ref 6.0–8.3)

## 2018-02-01 LAB — BASIC METABOLIC PANEL
BUN: 13 mg/dL (ref 6–23)
CO2: 26 mEq/L (ref 19–32)
Calcium: 9.8 mg/dL (ref 8.4–10.5)
Chloride: 102 mEq/L (ref 96–112)
Creatinine, Ser: 0.5 mg/dL (ref 0.40–1.20)
GFR: 143.84 mL/min (ref >60.00)
Glucose, Bld: 79 mg/dL (ref 70–99)
Potassium: 3.6 mEq/L (ref 3.5–5.1)
Sodium: 139 mEq/L (ref 135–145)

## 2018-02-01 LAB — LDL CHOLESTEROL, DIRECT: Direct LDL: 132 mg/dL

## 2018-02-01 LAB — TSH: TSH: 1.58 u[IU]/mL (ref 0.35–4.50)

## 2018-02-01 MED ORDER — PREDNISONE 20 MG PO TABS
ORAL_TABLET | ORAL | 1 refills | Status: DC
Start: 2018-02-01 — End: 2019-05-23

## 2018-02-01 MED ORDER — LORAZEPAM 0.5 MG PO TABS: ORAL_TABLET | ORAL | 2 refills | Status: DC

## 2018-02-01 MED ORDER — VALACYCLOVIR HCL 1 G PO TABS: ORAL_TABLET | ORAL | 1 refills | Status: AC

## 2018-02-01 MED ORDER — VARENICLINE TARTRATE 1 MG PO TABS: 1.0000 mg | ORAL_TABLET | Freq: Two times a day (BID) | ORAL | 10 refills | Status: DC

## 2018-02-01 MED ORDER — TRAZODONE HCL 50 MG PO TABS: 25.0000 mg | ORAL_TABLET | Freq: Every evening | ORAL | 4 refills | Status: DC | PRN

## 2018-02-01 MED ORDER — AMLODIPINE BESYLATE 5 MG PO TABS: 5.0000 mg | ORAL_TABLET | Freq: Every day | ORAL | 4 refills | Status: DC

## 2018-02-01 MED ORDER — EPINEPHRINE 0.3 MG/0.3ML IJ SOAJ: 0.3000 mg | Freq: Once | INTRAMUSCULAR | 2 refills | Status: AC

## 2018-02-01 MED ORDER — MEDROXYPROGESTERONE ACETATE 150 MG/ML IM SUSP
150.0000 mg | Freq: Once | INTRAMUSCULAR | Status: AC
  Administered 2018-02-01: 150 mg via INTRAMUSCULAR

## 2018-02-01 NOTE — Progress Notes (Signed)
Kathleen Rubio is a 42 year old single female smoker........ down to 3 cigarettes a day on the Chantix program for the past month. Takes 0.5 twice a day.  She comes in today for general physical examination  She has a history of hypertension and takes Norvasc 5 mg daily. BP today 114/76  She is due for her Depakote shot today  She takes Elavil 50 mg at bedtime for sleep but it doesn't seem to be working. In the past she's taken some 6.25 mg Ambien CR. We discussed going off of these 2 medicines and giving her a trial of trazodone.  She takes Valtrex 100 mg twice a day when necessary  She has a history of urticaria etiology unknown. It one time she had a severe reaction and had to take an EpiPen. She keeps EpiPen and some prednisone with her wherever she goes. Workup by immunology was negative for triggers.  She gets routine eye care, dental care, BSE monthly, annual mammography however she skipped last year. She is due to have a mammogram next month. Colonoscopy not until age 70. No family history of colon cancer polyps  Both her mother and paternal grandmother have had breast cancer. In the past she's had the BRCA1 and BRCA2 screening which were both negative. I advised her to call the geneticist at the [place] [facility] to see about the new genetic screening studies for GYN cancers.  Social history........Marland Kitchen single he is here in [place] works for a [employer].  Last fall she was coming down the stairs and tripped and fell. She broke 2 ribs which healed spontaneously. About 8 weeks after the fall she began having pain in her left hip. Since that time the pain is been fairly constant. It's worse in the morning when she first gets out of bed. When she walks around for a while it'll quiet down but never goes completely away.  BP 114/76 (BP Location: Left Arm, Patient Position: Sitting, Cuff Size: Normal)   Pulse (!) 102   Temp 98.3 F (36.8 C) (Oral)   Ht 5' 5"  (1.651 m)    Wt 116 lb (52.6 kg)   BMI 19.30 kg/m  Well-developed well-nourished female no acute distress vital signs stable she's afebrile HEENT were negative except she wears glasses. Neck was supple thyroid not enlarged. Cardiopulmonary exam normal. Breast exam normal. Abdominal exam normal. Pelvic examination external genitalia within normal limits vaginal vault was normal cervix is visualized Pap smears done bimanual exam was negative rectum normal stool guaiac negative extremities normal skin normal peripheral pulses normal.  Because of a complaint of her left hip we did a thorough hip exam. Right hip is normal left hip has some decrease external rotation and about 75 she starts to experience some pain.  #1 hypertension at goal........... continue current medication  #2 sleep dysfunction......... trial of trazodone  #3 tobacco abuse....... down to 3 cigarettes they wish Chantix 0.5 twice a day....... continue that program  #4 left hip pain........ x-ray hip

## 2018-02-01 NOTE — Patient Instructions (Signed)
Go to the [facility] [place] office for x-rays of your left hip  Continue the Chantix program............ 0.5 mg twice a day and taper off nicotine completely as we discussed  Continue current meds  Follow-up in one year sooner if any problems  We'll call you the report of your hip x-ray.Marland Kitchen

## 2018-02-02 LAB — CYTOLOGY - PAP: Diagnosis: NEGATIVE

## 2018-02-03 ENCOUNTER — Ambulatory Visit: Payer: Self-pay

## 2018-02-03 ENCOUNTER — Telehealth: Payer: Self-pay | Admitting: Family Medicine

## 2018-02-03 NOTE — Telephone Encounter (Unsigned)
Copied from CRM [id number]. Topic: Quick Communication - See Telephone Encounter >> Feb 03, 2018  5:06 PM Alexander Bergeron B wrote: Give pt labs

## 2018-02-03 NOTE — Telephone Encounter (Signed)
Pt given lab results per notes of Dr Tawanna Cooler on 02/03/18. Pt verbalized understanding.    Notes recorded by Roderick Pee, MD on 02/03/2018 at 3:27 PM EDT Harriett Sine please call Earnestine her labs were normal except her triglycerides were elevated but since she wasn't fasting that doesn't.

## 2018-02-06 NOTE — Telephone Encounter (Signed)
Left message to call back- see result notes 

## 2018-02-08 ENCOUNTER — Ambulatory Visit (INDEPENDENT_AMBULATORY_CARE_PROVIDER_SITE_OTHER): Payer: Managed Care, Other (non HMO)

## 2018-02-08 DIAGNOSIS — M25552 Pain in left hip: Secondary | ICD-10-CM

## 2018-02-10 ENCOUNTER — Ambulatory Visit: Payer: Self-pay

## 2018-02-15 ENCOUNTER — Other Ambulatory Visit: Payer: Self-pay | Admitting: Family Medicine

## 2018-02-15 ENCOUNTER — Telehealth: Payer: Self-pay | Admitting: Family Medicine

## 2018-02-15 DIAGNOSIS — M25552 Pain in left hip: Secondary | ICD-10-CM

## 2018-02-15 NOTE — Telephone Encounter (Signed)
Copied from CRM [id number]. Topic: Quick Communication - Lab Results >> Feb 15, 2018  8:05 AM Carola Rhine, CMA wrote: Called patient to inform them of  lab results. When patient returns call, triage nurse may disclose results.   Pt returning call about lab results.

## 2018-02-15 NOTE — Telephone Encounter (Signed)
Attempted to call patient, no answer.  Left message for patient to call back.

## 2018-02-24 ENCOUNTER — Ambulatory Visit: Payer: Managed Care, Other (non HMO) | Attending: Family Medicine | Admitting: Physical Therapy

## 2018-02-24 DIAGNOSIS — M25552 Pain in left hip: Secondary | ICD-10-CM | POA: Insufficient documentation

## 2018-02-24 DIAGNOSIS — M25652 Stiffness of left hip, not elsewhere classified: Secondary | ICD-10-CM | POA: Insufficient documentation

## 2018-02-24 DIAGNOSIS — M62838 Other muscle spasm: Secondary | ICD-10-CM | POA: Insufficient documentation

## 2018-03-02 ENCOUNTER — Ambulatory Visit: Payer: Managed Care, Other (non HMO) | Admitting: Physical Therapy

## 2018-03-07 ENCOUNTER — Encounter: Payer: Self-pay | Admitting: Physical Therapy

## 2018-03-07 ENCOUNTER — Other Ambulatory Visit: Payer: Self-pay

## 2018-03-07 ENCOUNTER — Ambulatory Visit: Payer: Managed Care, Other (non HMO) | Admitting: Physical Therapy

## 2018-03-07 DIAGNOSIS — M25552 Pain in left hip: Secondary | ICD-10-CM | POA: Diagnosis present

## 2018-03-07 DIAGNOSIS — M62838 Other muscle spasm: Secondary | ICD-10-CM | POA: Diagnosis present

## 2018-03-07 DIAGNOSIS — M25652 Stiffness of left hip, not elsewhere classified: Secondary | ICD-10-CM | POA: Diagnosis present

## 2018-03-07 NOTE — Patient Instructions (Signed)
Access Code: [email or web address]  URL: [email or web address]  Date: 03/07/2018  Prepared by: Marylyn IshiharaSara Kiser   Exercises  Alternating Single Leg Bridge - 10 reps - 3 sets - 1x daily - 7x weekly  Seated Piriformis Stretch - 10 reps - 3 sets - 1x daily - 7x weekly  Seated Piriformis Stretch - 10 reps - 3 sets - 1x daily - 7x weekly  Clamshell with Resistance - 10 reps - 3 sets - 1x daily - 7x weekly    [facility] [address], [address] [place], [place] [address] Phone # [phone] Fax [phone]

## 2018-03-08 NOTE — Therapy (Addendum)
[facility] [address] [place], [place], [address] Phone: [phone]   Fax:  [phone]  Physical Therapy Evaluation/Discharge  Patient Details  Name: Kathleen Rubio MRN: [id number] Date of Birth: [birth year] Referring Provider: Stevie Kern, MD    Encounter Date: 03/07/2018  PT End of Session - 03/08/18 0825    Visit Number  1    Number of Visits  9    Date for PT Re-Evaluation  04/06/18    Authorization Type  [employer] Managed     Authorization Time Period  03/07/18 to 04/06/18    PT Start Time  1446    PT Stop Time  1528    PT Time Calculation (min)  42 min    Activity Tolerance  Patient tolerated treatment well;No increased pain    Behavior During Therapy  WFL for tasks assessed/performed       Past Medical History:  Diagnosis Date  . Hyperlipidemia   . Hypertension     Past Surgical History:  Procedure Laterality Date  . WISDOM TOOTH EXTRACTION      There were no vitals filed for this visit.   Subjective Assessment - 03/07/18 1453    Subjective  Pt reports that she fell down the stairs end of last year and she broke 2 ribs. She healed from that, but noted that her Lt hip started bothering her suddenly approximately 3 months ago. Her leg will give out on her every now, but mostly this is a result of the pain that shoots down the lateral aspect of her hip down to the knee. Typically this will bother her after standing from sitting and is alleviated after she walks around for a little bit.  She denies any numbness and tingling.     Pertinent History  none significant    Diagnostic tests  xray: negative     Patient Stated Goals  decrease pain     Currently in Pain?  No/denies none currently    Pain Location  Hip    Pain Orientation  Left;Lateral    Pain Descriptors / Indicators  Aching    Pain Type  Chronic pain    Pain Radiating Towards  down lateral thigh, sometimes has medial thigh pain    Pain Onset  More  than a month ago    Pain Frequency  Intermittent    Aggravating Factors   standing after sitting for a while, otherwise it's random     Pain Relieving Factors  unsure, but usually resolves after walking for a little while         [facility] PT Assessment - 03/08/18 0001      Assessment   Medical Diagnosis  Lt hip pain    Referring Provider  Stevie Kern, MD     Onset Date/Surgical Date  -- ~3 months ago     Next MD Visit  none for now       Precautions   Precautions  None      Balance Screen   Has the patient fallen in the past 6 months  No    Has the patient had a decrease in activity level because of a fear of falling?   No    Is the patient reluctant to leave their home because of a fear of falling?   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)     Leisure  going on walk, golfing      Cognition   Overall Cognitive Status  Within Functional Limits for tasks assessed      Observation/Other Assessments   Focus on Therapeutic Outcomes (FOTO)   46% limitation      Sensation   Additional Comments  denies numbness and tingling       AROM   Overall AROM Comments  lumbar flexion WNL, painful coming up; Rt rotation limited to 35 deg and painful; all others within normal limits  pain located over Lt hip     AROM Assessment Site  Hip    Right/Left Hip  Right;Left    Right Hip Internal Rotation   30    Left Hip Internal Rotation   20 discomfort over Lt hip       Strength   Right Hip Extension  4/5    Right Hip External Rotation   5/5    Right Hip ABduction  4/5    Left Hip Extension  4/5    Left Hip External Rotation  4/5    Left Hip Internal Rotation  4/5    Left Hip ABduction  4/5    Right Knee Flexion  5/5    Right Knee Extension  5/5    Left Knee Flexion  5/5    Left Knee Extension  5/5      Palpation   Spinal mobility  hypomobile lumbar and low thoracic spine, pain free     Palpation comment  mild tenderness over Lt  glute med and TFL, tender with palpation over Lt adductor muscle group       Special Tests   Other special tests  passive straight leg raise negative       Ambulation/Gait   Gait Comments  Pt ambulates with slight limp on the Lt, with excessive abduction and ER                Objective measurements completed on examination: See above findings.      [facility] PT Treatment/Exercise - 03/08/18 0001      Exercises   Exercises  Knee/Hip      Knee/Hip Exercises: Supine   Bridges  Both;1 set;10 reps      Knee/Hip Exercises: Sidelying   Clams  x8 reps LLE for HEP demo, using green TB              PT Education - 03/08/18 0824    Education Details  eval findings/POC; HEP initiated (bridge, clamshell in sidelying)    Person(s) Educated  Patient    Methods  Explanation;Handout;Verbal cues    Comprehension  Verbalized understanding;Returned demonstration       PT Short Term Goals - 03/07/18 1554      PT SHORT TERM GOAL #1   Title  Pt will be independent with her initial HEP to improve hip mobility and decrease pain.    Time  2    Period  Weeks    Status  New    Target Date  03/21/18        PT Long Term Goals - 03/08/18 0833      PT LONG TERM GOAL #1   Title  Pt will demo improved hip strength to 5/5 MMT which will improve her efficiency with daily activity.     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     Time  4    Period  Weeks    Status  New      PT LONG TERM GOAL #3   Title  Pt will demo improved Lt hip IR to atleast 30 deg in order to decrease strain to the low back during golf.     Time  4      PT LONG TERM GOAL #4   Title  Pt will report atleast 50% improvement in her pain/symptoms from the start of  therapy which will allow her to resume trail walks and return to golf with less difficulty.     Time  4    Period  Weeks    Status  New      PT LONG TERM GOAL #5   Title  Pt will be independent with an advanced HEP to encourage further gains in strength and flexibility which will prevent return of her symptoms in the future.     Time  4    Period  Weeks    Status  New             Plan - 03/08/18 1696    Clinical Impression Statement  Pt is a pleasant 42 y.o F referred to OPPT with complaints of Lt hip pain onset approximately 3 months ago. She has noticed no change in her pain since the onset and denies any change in bowerl/bladder function. She also denies any numbness and tingling. Pt's pain is reproduced with lumbar flexion and Rt rotation, but resolved within a few seconds. She presents with hypomobility of the lumbar spine, limited Lt hip rotation, and weakness of hip flexion, abduction and extension. She typically is moderately active, but has not been going on her regular walks or playing golf due to her recent onset of pain. She would benefit from skilled PT to address the noted limitations in spine mobility, hip mobility, hip strength and facilitate her return to regular physical activity.    History and Personal Factors relevant to plan of care:  pt notes a fall at the end of 2018 which resulted in 2 broken ribs on the Lt, this is healed     Clinical Presentation  Stable    Clinical Decision Making  Low    Rehab Potential  Good    PT Frequency  2x / week    PT Duration  4 weeks    PT Treatment/Interventions  ADLs/Self Care Home Management;Electrical Stimulation;Cryotherapy;Moist Heat;Traction;Therapeutic exercise;Therapeutic activities;Gait training;Balance training;Neuromuscular re-education;Patient/family education;Manual techniques;Passive range of motion;Dry needling    PT Next Visit Plan  f/u on HEP adherence; lumbar mobilizations, hip mobs for IR, progress hip  flexion/abduction/extension strength    PT Home Exercise Plan  update next visit    Consulted and Agree with Plan of Care  Patient       Patient will benefit from skilled therapeutic intervention in order to improve the following deficits and impairments:  Abnormal gait, Decreased activity tolerance, Difficulty walking, Impaired flexibility, Hypomobility, Decreased strength, Increased muscle spasms, Postural dysfunction, Pain, Improper body mechanics  Visit Diagnosis: Pain in left hip  Other muscle spasm  Stiffness of left hip, not elsewhere classified     Problem List Patient Active Problem List   Diagnosis Date Noted  .  Left hip pain 02/01/2018  . Missed period 02/01/2018  . Essential hypertension 11/05/2016  . Routine general medical examination at a health care facility 10/18/2016  . Hyperlipemia, mixed 05/20/2016  . Contraceptive management 05/20/2016  . Tobacco use disorder 05/20/2016  . Urticaria of unknown origin 05/20/2016  . Hypertriglyceridemia 10/13/2015   9:37 AM,03/08/18 Sherol Dade PT, DPT [place] [address] [place], [place], [address] Phone: [phone]   Fax:  [phone]  Name: Kathleen Rubio MRN: [id number] Date of Birth: 20-Feb-1976   *addendum to resolve episode of care and d/c pt from skilled PT.  PHYSICAL THERAPY DISCHARGE SUMMARY  Visits from Start of Care: 1  Current functional level related to goals / functional outcomes: See above for more details    Remaining deficits: See above for more details    Education / Equipment: See above for more details  Plan: Patient agrees to discharge.  Patient goals were not met. Patient is being discharged due to not returning since the last visit.  ?????    2:42 PM,04/20/18 [place], [place] [facility] [place]

## 2018-03-15 ENCOUNTER — Encounter: Payer: Managed Care, Other (non HMO) | Admitting: Physical Therapy

## 2018-03-17 ENCOUNTER — Encounter: Payer: Managed Care, Other (non HMO) | Admitting: Physical Therapy

## 2018-03-22 ENCOUNTER — Telehealth: Payer: Self-pay | Admitting: Physical Therapy

## 2018-03-22 ENCOUNTER — Ambulatory Visit: Payer: Managed Care, Other (non HMO) | Attending: Family Medicine | Admitting: Physical Therapy

## 2018-03-22 NOTE — Telephone Encounter (Signed)
No show. Called and left voicemail for pt to call the office at her earliest convenience.  9:02 AM,03/22/18 Donita BrooksSara Priya Matsen PT, DPT [facility]  [phone]

## 2018-03-28 ENCOUNTER — Ambulatory Visit: Payer: Managed Care, Other (non HMO)

## 2018-05-17 ENCOUNTER — Other Ambulatory Visit: Payer: Self-pay | Admitting: Family Medicine

## 2018-05-17 DIAGNOSIS — Z0000 Encounter for general adult medical examination without abnormal findings: Secondary | ICD-10-CM

## 2018-05-27 ENCOUNTER — Other Ambulatory Visit: Payer: Self-pay | Admitting: Family Medicine

## 2018-08-21 ENCOUNTER — Other Ambulatory Visit: Payer: Self-pay | Admitting: Family Medicine

## 2018-08-21 DIAGNOSIS — Z0000 Encounter for general adult medical examination without abnormal findings: Secondary | ICD-10-CM

## 2018-08-24 ENCOUNTER — Other Ambulatory Visit: Payer: Self-pay | Admitting: Family Medicine

## 2018-08-24 DIAGNOSIS — Z1231 Encounter for screening mammogram for malignant neoplasm of breast: Secondary | ICD-10-CM

## 2018-09-11 ENCOUNTER — Encounter: Payer: Managed Care, Other (non HMO) | Admitting: Family Medicine

## 2018-09-11 NOTE — Progress Notes (Deleted)
Kathleen Rubio DOB: 05/17/1976 Encounter date: 09/11/2018  This isa 42 y.o. female who presents to establish care. No chief complaint on file.   History of present illness: HTN: Hypertriglyceridemia:  Tobacco use disorder:  Last bloodwork in May 2019:TG over 500.  ***   Past Medical History:  Diagnosis Date  . Hyperlipidemia   . Hypertension    Past Surgical History:  Procedure Laterality Date  . WISDOM TOOTH EXTRACTION     No Known Allergies No outpatient medications have been marked as taking for the 09/11/18 encounter (Appointment) with Wynn BankerKoberlein, Junell C, MD.   Social History   Tobacco Use  . Smoking status: Current Every Day Smoker  . Smokeless tobacco: Never Used  . Tobacco comment: smokes 1pack every 2-4 days  Substance Use Topics  . Alcohol use: Yes    Alcohol/week: 4.0 standard drinks    Types: 4 Standard drinks or equivalent per week    Comment: 2 glasses of wine per week   Family History  Problem Relation Age of Onset  . Breast cancer Mother 10152       recurrence at age 42  . Cancer Mother 2367       head/neck ca  . Diabetes Father        type 2  . Prostate cancer Father 3958       currently 4178  . Hyperlipidemia Father   . Breast cancer Maternal Grandmother        Dx 550s; deceased 4550s     Review of Systems  Objective:  There were no vitals taken for this visit.      BP Readings from Last 3 Encounters:  02/01/18 114/76  07/15/17 118/82  07/12/17 (!) 135/105   Wt Readings from Last 3 Encounters:  02/01/18 116 lb (52.6 kg)  07/15/17 121 lb 1.6 oz (54.9 kg)  07/12/17 110 lb (49.9 kg)    Physical Exam  Assessment/Plan:  There are no diagnoses linked to this encounter.  No follow-ups on file.  Theodis ShoveJunell Koberlein, MD

## 2018-09-12 ENCOUNTER — Other Ambulatory Visit (HOSPITAL_COMMUNITY): Payer: Self-pay | Admitting: Family Medicine

## 2018-09-12 DIAGNOSIS — Z1231 Encounter for screening mammogram for malignant neoplasm of breast: Secondary | ICD-10-CM

## 2018-09-14 ENCOUNTER — Encounter (HOSPITAL_COMMUNITY): Payer: Managed Care, Other (non HMO)

## 2018-09-15 ENCOUNTER — Other Ambulatory Visit: Payer: Self-pay | Admitting: Family Medicine

## 2018-09-15 DIAGNOSIS — Z1231 Encounter for screening mammogram for malignant neoplasm of breast: Secondary | ICD-10-CM

## 2018-09-19 ENCOUNTER — Ambulatory Visit (INDEPENDENT_AMBULATORY_CARE_PROVIDER_SITE_OTHER): Payer: Self-pay | Admitting: *Deleted

## 2018-09-19 ENCOUNTER — Ambulatory Visit: Payer: Managed Care, Other (non HMO)

## 2018-09-19 DIAGNOSIS — Z308 Encounter for other contraceptive management: Secondary | ICD-10-CM

## 2018-09-19 DIAGNOSIS — Z23 Encounter for immunization: Secondary | ICD-10-CM

## 2018-09-19 LAB — POCT URINE PREGNANCY: Preg Test, Ur: NEGATIVE

## 2018-09-19 MED ORDER — MEDROXYPROGESTERONE ACETATE 150 MG/ML IM SUSP
150.0000 mg | Freq: Once | INTRAMUSCULAR | Status: AC
  Administered 2018-09-19: 150 mg via INTRAMUSCULAR

## 2018-09-19 NOTE — Progress Notes (Signed)
Per orders of Shirline Freesory Nafziger, NP, injection of Medroxy Progesterone given by Kern Reapachel Guadalupe Kerekes. Patient tolerated injection well.  Urine pregnancy was negative.  Patient to return 01/12/19.

## 2018-10-03 ENCOUNTER — Ambulatory Visit: Payer: Managed Care, Other (non HMO)

## 2019-05-08 ENCOUNTER — Telehealth: Payer: Self-pay | Admitting: *Deleted

## 2019-05-08 NOTE — Telephone Encounter (Signed)
Message sent to Dr. Martinique for review and approval.  Copied from [place] [phone]. Topic: Appointment Scheduling - Scheduling Inquiry for Clinic >> May 07, 2019  3:13 PM Sheran Luz wrote: Patient calling to inquire if she can come in for depo and flu shot before appointment in September. >> May 07, 2019  4:05 PM Cox, Melburn Hake, CMA wrote: Pt scheduled for [facility] w.[facility]

## 2019-05-08 NOTE — Telephone Encounter (Signed)
Last OV 04/2016. She can have her Depo shot where has been receiving it . I just do not feel comfortable signing for Depo shot w/o a visit in the past year. Thanks, BJ

## 2019-05-08 NOTE — Telephone Encounter (Signed)
Left message to return phone call.

## 2019-05-09 NOTE — Telephone Encounter (Signed)
Pt calling back. Please call back   Cb#[phone]

## 2019-05-09 NOTE — Telephone Encounter (Signed)
Patient informed and verbalized understanding

## 2019-05-23 ENCOUNTER — Encounter: Payer: Self-pay | Admitting: Family Medicine

## 2019-05-23 ENCOUNTER — Ambulatory Visit (INDEPENDENT_AMBULATORY_CARE_PROVIDER_SITE_OTHER): Payer: Self-pay | Admitting: Family Medicine

## 2019-05-23 ENCOUNTER — Other Ambulatory Visit: Payer: Self-pay

## 2019-05-23 VITALS — BP 126/70 | HR 90 | Temp 97.8°F | Resp 12 | Ht 65.0 in | Wt 122.2 lb

## 2019-05-23 DIAGNOSIS — I10 Essential (primary) hypertension: Secondary | ICD-10-CM

## 2019-05-23 DIAGNOSIS — Z23 Encounter for immunization: Secondary | ICD-10-CM

## 2019-05-23 DIAGNOSIS — Z309 Encounter for contraceptive management, unspecified: Secondary | ICD-10-CM

## 2019-05-23 DIAGNOSIS — E782 Mixed hyperlipidemia: Secondary | ICD-10-CM

## 2019-05-23 LAB — POCT URINE PREGNANCY: Preg Test, Ur: NEGATIVE

## 2019-05-23 MED ORDER — FENOFIBRATE 54 MG PO TABS
ORAL_TABLET | ORAL | 0 refills | Status: DC
Start: 2019-05-23 — End: 2019-08-28

## 2019-05-23 MED ORDER — MEDROXYPROGESTERONE ACETATE 150 MG/ML IM SUSP
150.0000 mg | Freq: Once | INTRAMUSCULAR | Status: AC
  Administered 2019-05-23: 150 mg via INTRAMUSCULAR

## 2019-05-23 MED ORDER — AMLODIPINE BESYLATE 5 MG PO TABS: 5.0000 mg | ORAL_TABLET | Freq: Every day | ORAL | 3 refills | Status: DC

## 2019-05-23 NOTE — Patient Instructions (Addendum)
A few things to remember from today's visit:   Encounter for contraceptive management, unspecified type - Plan: POCT urine pregnancy  Essential hypertension - Plan: amLODipine (NORVASC) 5 MG tablet  Hyperlipemia, mixed  Tobacco use disorder   Please be sure medication list is accurate. If a new problem present, please set up appointment sooner than planned today.

## 2019-05-23 NOTE — Progress Notes (Signed)
HPI:   Kathleen Rubio is a 43 y.o. female, who is here today requesting refill on medication and requesting Depo-Medrol injection.  She is former pt of Dr Tawanna Cooler.   Chronic medical problems: She needs refill on fenofibrate and Depo Provera. She lives with her 19 yo nephew.  Mixed hyperlipidemia/hypertriglyceridemia, she follows at [facility], Dr. Eligah East (endocrinologist) but she has not been there for a few months. She would like to have a refill for fenofibrate. She follows a low-fat diet, but has been harder to do since she has been working from home. According to pt,she has not tried statins or Lopid. She has not noted abdominal pain,nausea,or vomiting.   Lab Results  Component Value Date   CHOL 296 (H) 02/01/2018   HDL 118.80 02/01/2018   LDLCALC UNABLE TO CALCULATE IF TRIGLYCERIDE OVER 400 mg/dL 33/83/2919   LDLDIRECT 132.0 02/01/2018   TRIG (H) 02/01/2018    529.0 Triglyceride is over 400; calculations on Lipids are invalid.   CHOLHDL 2 02/01/2018   Lab Results  Component Value Date   ALT 8 02/01/2018   AST 12 02/01/2018   ALKPHOS 47 02/01/2018   BILITOT 0.5 02/01/2018    She is also requesting Depo Provera injection,which she has been taken for contraception and irregular menses. She has not had an injection since 08/2018. She denies sex intercourse for about a year. Tolerating medication well,deneis side effects.  She is having monthly menses since 122/2019,LMP 05/09/19. M:16. G:0  HTN,she is on Amlodipine 5 mg daily. Dx at age 26,started on pharmacologic treatment 2-3 years ago.  Denies severe/frequent headache, visual changes, chest pain, dyspnea, palpitation, focal weakness, or edema.  Lab Results  Component Value Date   CREATININE 0.50 02/01/2018   BUN 13 02/01/2018   NA 139 02/01/2018   K 3.6 02/01/2018   CL 102 02/01/2018   CO2 26 02/01/2018   She also has Hx of insomnia,which has improved since she has been working from home. Her job intails  frequent travel and this seemed to be aggravating problem. She was on Trazodone 50 mg daily prn. She was also on Lorazepam.  She is sleeping about 4-5 hours straight,wakes up to use bathroom and goes back to sleep,sometimes it takes her long to do so.   Review of Systems  Constitutional: Negative for activity change, appetite change, fatigue, fever and unexpected weight change.  HENT: Negative for mouth sores, nosebleeds and trouble swallowing.   Eyes: Negative for redness and visual disturbance.  Respiratory: Negative for cough and wheezing.   Gastrointestinal: Negative for abdominal pain, nausea and vomiting.       Negative for changes in bowel habits.  Genitourinary: Negative for decreased urine volume, dysuria and hematuria.  Musculoskeletal: Negative for arthralgias and myalgias.  Neurological: Negative for syncope and facial asymmetry.  Rest see pertinent positives and negatives per HPI.   Current Outpatient Medications on File Prior to Visit  Medication Sig Dispense Refill  . aspirin-acetaminophen-caffeine (EXCEDRIN MIGRAINE) 250-250-65 MG tablet Take 2 tablets by mouth every 6 (six) hours as needed for headache.    Marland Kitchen EPINEPHrine 0.3 mg/0.3 mL IJ SOAJ injection Inject 0.3 mg into the muscle as needed for anaphylaxis.    . medroxyPROGESTERone (DEPO-PROVERA) 150 MG/ML injection Inject 1 mL (150 mg total) into the muscle every 3 (three) months. 1 mL 11  . valACYclovir (VALTREX) 1000 MG tablet TAKE 1 TABLET (1,000 MG TOTAL) BY MOUTH 2 (TWO) TIMES DAILY. 90 tablet 1   No current  facility-administered medications on file prior to visit.      Past Medical History:  Diagnosis Date  . Hyperlipidemia   . Hypertension    No Known Allergies  Family History  Problem Relation Age of Onset  . Breast cancer Mother 4052       recurrence at age 43  . Cancer Mother 3767       head/neck ca  . Diabetes Father        type 2  . Prostate cancer Father [id number]       currently 4878  .  Hyperlipidemia Father   . Breast cancer Maternal Grandmother        Dx 6050s; deceased 6250s    Social History   Socioeconomic History  . Marital status: Married    Spouse name: Not on file  . Number of children: Not on file  . Years of education: Not on file  . Highest education level: Not on file  Occupational History  . Not on file  Social Needs  . Financial resource strain: Not on file  . Food insecurity    Worry: Not on file    Inability: Not on file  . Transportation needs    Medical: Not on file    Non-medical: Not on file  Tobacco Use  . Smoking status: Current Every Day Smoker  . Smokeless tobacco: Never Used  . Tobacco comment: smokes 1pack every 2-4 days  Substance and Sexual Activity  . Alcohol use: Yes    Alcohol/week: 4.0 standard drinks    Types: 4 Standard drinks or equivalent per week    Comment: 2 glasses of wine per week  . Drug use: No  . Sexual activity: Yes    Partners: Male    Birth control/protection: Injection    Comment: Depo Provera  Lifestyle  . Physical activity    Days per week: Not on file    Minutes per session: Not on file  . Stress: Not on file  Relationships  . Social Musicianconnections    Talks on phone: Not on file    Gets together: Not on file    Attends religious service: Not on file    Active member of club or organization: Not on file    Attends meetings of clubs or organizations: Not on file    Relationship status: Not on file  Other Topics Concern  . Not on file  Social History Narrative  . Not on file    Vitals:   05/23/19 1041  BP: 126/70  Pulse: 90  Resp: 12  Temp: 97.8 F (36.6 C)  SpO2: 98%    Body mass index is 20.34 kg/m.  Physical Exam  Nursing note and vitals reviewed. Constitutional: She is oriented to person, place, and time. She appears well-developed and well-nourished. No distress.  HENT:  Head: Normocephalic and atraumatic.  Mouth/Throat: Oropharynx is clear and moist and mucous membranes are  normal.  Eyes: Pupils are equal, round, and reactive to light. Conjunctivae are normal.  Mild palpebral edema, bilateral.  Cardiovascular: Normal rate and regular rhythm.  No murmur heard. Pulses:      Dorsalis pedis pulses are 2+ on the right side and 2+ on the left side.  Respiratory: Effort normal and breath sounds normal. No respiratory distress.  GI: Soft. She exhibits no mass. There is no hepatomegaly. There is no abdominal tenderness.  Musculoskeletal:        General: No edema.  Lymphadenopathy:    She has no  cervical adenopathy.  Neurological: She is alert and oriented to person, place, and time. She has normal strength. No cranial nerve deficit. Gait normal.  Skin: Skin is warm. No rash noted. No erythema.  Psychiatric: She has a normal mood and affect.  Well groomed, good eye contact.    ASSESSMENT AND PLAN:  Ms. Ryen was seen today for transfer of care.  Diagnoses and all orders for this visit:  Essential hypertension BP adequately controlled. Recommend monitoring BP regularly. Continue Amlodipine 5 mg daily.  -     amLODipine (NORVASC) 5 MG tablet; Take 1 tablet (5 mg total) by mouth daily.  Encounter for contraceptive management, unspecified type Side effects of Depo Provera. Adequate Ca++ intake and regular physical activity recommended.  -     POCT urine pregnancy -     medroxyPROGESTERone (DEPO-PROVERA) injection 150 mg  Hyperlipemia, mixed Problem doe snot seem to be well controlled based on last FLP. She does not want to have labs today ,prefers to hold until her CPE. Possible complications of HLD discussed.   No changes in Fenofibrate dose. Recommend arranging appt with her endocrinologist for follow up.  -     fenofibrate 54 MG tablet; TAKE 1 TABLET BY MOUTH EVERY DAY.  Need for influenza vaccination -     Flu Vaccine QUAD 36+ mos IM   Return in 3 months (on 08/22/2019) for Needs cpe..     Betty G. Martinique, MD  [facility].  [place] office.

## 2019-05-31 NOTE — Telephone Encounter (Signed)
-----   Message from Erline Hau, MD sent at 05/23/2019 11:41 AM EDT ----- Regarding: FW: TOC Thoughts? [place] ----- Message ----- From: Beverlee Nims Sent: 05/23/2019  11:31 AM EDT To: Erline Hau, MD, # Subject: TOC                                            The patient would like to transfer care from Dr. Martinique to Dr. Jerilee Hoh. She said that theres nothing wrong with Dr. Martinique it's just that she was with Dr. Sherren Mocha for so long and she wants to still have Apolonio Schneiders as her nurse since she's been with her for soooo long.

## 2019-08-28 ENCOUNTER — Other Ambulatory Visit: Payer: Self-pay | Admitting: Family Medicine

## 2019-08-28 ENCOUNTER — Encounter: Payer: Self-pay | Admitting: Family Medicine

## 2019-08-28 DIAGNOSIS — E782 Mixed hyperlipidemia: Secondary | ICD-10-CM

## 2019-08-28 MED ORDER — FENOFIBRATE 54 MG PO TABS: ORAL_TABLET | ORAL | 0 refills | Status: DC

## 2019-08-28 NOTE — Telephone Encounter (Signed)
DENIED.  THIS PATIENT DOES NOT HAVE A PRIMARY CARE PHYSICIAN.

## 2019-09-11 ENCOUNTER — Encounter: Payer: Self-pay | Admitting: *Deleted

## 2019-12-14 ENCOUNTER — Ambulatory Visit: Payer: Self-pay | Attending: Internal Medicine

## 2019-12-14 DIAGNOSIS — Z23 Encounter for immunization: Secondary | ICD-10-CM

## 2019-12-14 NOTE — Progress Notes (Signed)
   Covid-19 Vaccination Clinic  Name:  Jenae Tomasello    MRN: [id number] DOB: [birth year]  12/14/2019  Ms. Sohail was observed post Covid-19 immunization for 15 minutes without incident. She was provided with Vaccine Information Sheet and instruction to access the V-Safe system.   Ms. Quinby was instructed to call 911 with any severe reactions post vaccine: Marland Kitchen Difficulty breathing  . Swelling of face and throat  . A fast heartbeat  . A bad rash all over body  . Dizziness and weakness   Immunizations Administered    Name Date Dose VIS Date Route   Pfizer COVID-19 Vaccine 12/14/2019  9:16 AM 0.3 mL 08/31/2019 Intramuscular   Manufacturer: [employer], [employer]   Lot: [id number]   NDC: [address]-4

## 2020-01-07 ENCOUNTER — Ambulatory Visit: Payer: Self-pay

## 2020-01-09 ENCOUNTER — Ambulatory Visit: Payer: Self-pay

## 2020-01-15 ENCOUNTER — Ambulatory Visit: Payer: Self-pay | Attending: Internal Medicine

## 2020-01-15 DIAGNOSIS — Z23 Encounter for immunization: Secondary | ICD-10-CM

## 2020-01-15 NOTE — Progress Notes (Signed)
   Covid-19 Vaccination Clinic  Name:  Kathleen Rubio    MRN: [id number] DOB: November 17, 1975  01/15/2020  Kathleen Rubio was observed post Covid-19 immunization for 30 minutes based on pre-vaccination screening without incident. She was provided with Vaccine Information Sheet and instruction to access the V-Safe system.   Kathleen Rubio was instructed to call 911 with any severe reactions post vaccine: Marland Kitchen Difficulty breathing  . Swelling of face and throat  . A fast heartbeat  . A bad rash all over body  . Dizziness and weakness   Immunizations Administered    Name Date Dose VIS Date Route   Pfizer COVID-19 Vaccine 01/15/2020  9:32 AM 0.3 mL 11/14/2018 Intramuscular   Manufacturer: [employer], [employer]   Lot: [id number]   NDC: [address]-5

## 2020-03-12 ENCOUNTER — Other Ambulatory Visit: Payer: Self-pay | Admitting: Family Medicine

## 2020-03-12 DIAGNOSIS — Z1231 Encounter for screening mammogram for malignant neoplasm of breast: Secondary | ICD-10-CM

## 2020-03-20 ENCOUNTER — Ambulatory Visit: Payer: Self-pay

## 2020-04-02 ENCOUNTER — Ambulatory Visit: Payer: No Typology Code available for payment source | Admitting: Family Medicine

## 2020-04-14 ENCOUNTER — Other Ambulatory Visit: Payer: Self-pay | Admitting: Orthopedic Surgery

## 2020-04-14 DIAGNOSIS — M533 Sacrococcygeal disorders, not elsewhere classified: Secondary | ICD-10-CM

## 2020-04-22 ENCOUNTER — Ambulatory Visit
Admission: RE | Admit: 2020-04-22 | Discharge: 2020-04-22 | Disposition: A | Discharge status: Home / Self Care | Payer: No Typology Code available for payment source | Source: Ambulatory Visit | Attending: Orthopedic Surgery | Admitting: Orthopedic Surgery

## 2020-04-22 ENCOUNTER — Other Ambulatory Visit: Payer: Self-pay

## 2020-04-22 DIAGNOSIS — M533 Sacrococcygeal disorders, not elsewhere classified: Secondary | ICD-10-CM

## 2020-04-23 ENCOUNTER — Ambulatory Visit
Admission: RE | Admit: 2020-04-23 | Discharge: 2020-04-23 | Disposition: A | Discharge status: Home / Self Care | Payer: No Typology Code available for payment source | Source: Ambulatory Visit | Attending: Family Medicine | Admitting: Family Medicine

## 2020-04-23 DIAGNOSIS — Z1231 Encounter for screening mammogram for malignant neoplasm of breast: Secondary | ICD-10-CM

## 2020-04-25 ENCOUNTER — Other Ambulatory Visit: Payer: Self-pay | Admitting: Orthopedic Surgery

## 2020-04-28 ENCOUNTER — Other Ambulatory Visit: Payer: Self-pay | Admitting: Family Medicine

## 2020-04-28 ENCOUNTER — Encounter: Payer: Self-pay | Admitting: Family Medicine

## 2020-04-28 DIAGNOSIS — E782 Mixed hyperlipidemia: Secondary | ICD-10-CM

## 2020-04-28 DIAGNOSIS — R928 Other abnormal and inconclusive findings on diagnostic imaging of breast: Secondary | ICD-10-CM

## 2020-04-30 MED ORDER — FENOFIBRATE 54 MG PO TABS: ORAL_TABLET | ORAL | 0 refills | Status: DC

## 2020-05-09 ENCOUNTER — Ambulatory Visit
Admission: RE | Admit: 2020-05-09 | Discharge: 2020-05-09 | Disposition: A | Discharge status: Home / Self Care | Payer: No Typology Code available for payment source | Source: Ambulatory Visit | Attending: Family Medicine | Admitting: Family Medicine

## 2020-05-09 ENCOUNTER — Other Ambulatory Visit: Payer: Self-pay

## 2020-05-09 DIAGNOSIS — R928 Other abnormal and inconclusive findings on diagnostic imaging of breast: Secondary | ICD-10-CM

## 2020-05-14 ENCOUNTER — Other Ambulatory Visit: Payer: Self-pay | Admitting: Chiropractor

## 2020-05-14 ENCOUNTER — Ambulatory Visit
Admission: RE | Admit: 2020-05-14 | Discharge: 2020-05-14 | Disposition: A | Discharge status: Home / Self Care | Payer: No Typology Code available for payment source | Source: Ambulatory Visit | Attending: Chiropractor | Admitting: Chiropractor

## 2020-05-14 DIAGNOSIS — M25559 Pain in unspecified hip: Secondary | ICD-10-CM

## 2020-05-15 ENCOUNTER — Ambulatory Visit: Payer: No Typology Code available for payment source | Admitting: Family Medicine

## 2020-05-15 ENCOUNTER — Encounter: Payer: Self-pay | Admitting: Family Medicine

## 2020-05-15 ENCOUNTER — Other Ambulatory Visit: Payer: Self-pay

## 2020-05-15 ENCOUNTER — Telehealth: Payer: Self-pay | Admitting: Family Medicine

## 2020-05-15 DIAGNOSIS — M87052 Idiopathic aseptic necrosis of left femur: Secondary | ICD-10-CM

## 2020-05-15 DIAGNOSIS — G8929 Other chronic pain: Secondary | ICD-10-CM | POA: Diagnosis not present

## 2020-05-15 DIAGNOSIS — M87051 Idiopathic aseptic necrosis of right femur: Secondary | ICD-10-CM

## 2020-05-15 DIAGNOSIS — M5441 Lumbago with sciatica, right side: Secondary | ICD-10-CM | POA: Diagnosis not present

## 2020-05-15 NOTE — Progress Notes (Signed)
Office Visit Note   Patient: Kathleen Rubio           Date of Birth: 18-Apr-1976           MRN: [id number] Visit Date: 05/15/2020 Requested by: No referring provider defined for this encounter. PCP: Patient, No Pcp Per  Subjective: Chief Complaint  Patient presents with  . Left Hip - Pain  . Right Hip - Pain    HPI: She is here at the request of Dr. Mayford Knife for bilateral hip pain.  About a year and a half ago she started having pain in the right posterior hip/low back area.  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  She eventually underwent SI joint injection which unfortunately did not help.  She has done physical therapy, has tried some medications, but nothing really seems to be helping.  She used to be athletic and flexible, but now she can hardly walk.  Her hips are very stiff and she feels a lot of pain on the posterior aspect of the right hip with some radiation down the leg occasionally into the foot.  Recently she went to see Dr. Mayford Knife for the first time and he obtained some x-rays and was concerned about avascular necrosis in her hips so he referred her here.  Patient is a smoker.  She does not drink alcohol heavily.  No history of oral steroid use.  She has a genetic lipid problem but no other health issues.                ROS:   All other systems were reviewed and are negative.  Objective: Vital Signs: LMP 04/23/2020   Physical Exam:  General:  Alert and oriented, in no acute distress. Pulm:  Breathing unlabored. Psy:  Normal mood, congruent affect.  Low back: She has some tenderness in the right sciatic notch.  She is not significantly tender over the SI joint today.  Straight leg raise is negative, lower extremity strength and reflexes are normal except for right ankle eversion which is weak at 4/5. Hips: She has extremely limited range of motion of both hips with internal rotation.  I can barely get her to a neutral  position.   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   Assessment & Plan: 1.  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 -We will proceed with MRI of the lumbar spine and both hips.  I will have her consult with Dr. Magnus Ivan because I think at some point she will need bilateral hip replacements. -She will continue working with Dr. Mayford Knife for her presumed lumbar disc issues.     Procedures: No procedures performed  No notes on file     PMFS History: Patient Active Problem List   Diagnosis Date Noted  . Left hip pain 02/01/2018  . Missed period 02/01/2018  . Essential hypertension 11/05/2016  . Routine general medical examination at a health care facility 10/18/2016  . Hyperlipemia, mixed 05/20/2016  . Contraceptive management 05/20/2016  . Tobacco use disorder 05/20/2016  . Urticaria of unknown origin 05/20/2016  . Hypertriglyceridemia 10/13/2015   Past Medical History:  Diagnosis Date  . Hyperlipidemia   . Hypertension     Family History  Problem Relation Age of Onset  . Breast cancer Mother 42  recurrence at age 42  . Cancer Mother 12       head/neck ca  . Diabetes Father        type 2  . Prostate cancer Father 18       currently 59  . Hyperlipidemia Father   . Breast cancer Maternal Grandmother        Dx 11s; deceased 44s    Past Surgical History:  Procedure Laterality Date  . WISDOM TOOTH EXTRACTION     Social History   Occupational History  . Not on file  Tobacco Use  . Smoking status: Current Every Day Smoker  . Smokeless tobacco: Never Used  . Tobacco comment: smokes 1pack every 2-4 days  Substance and Sexual Activity  . Alcohol use: Yes    Alcohol/week: 4.0 standard drinks    Types: 4 Standard drinks or equivalent per week    Comment: 2  glasses of wine per week  . Drug use: No  . Sexual activity: Yes    Partners: Male    Birth control/protection: Injection    Comment: Depo Provera

## 2020-05-15 NOTE — Telephone Encounter (Addendum)
Called voicemail message to return call to schedule an appointment with Dr Prince Rome for lower back pain right sided sciatica

## 2020-05-15 NOTE — Progress Notes (Signed)
Can you make her appt?

## 2020-05-19 DIAGNOSIS — M879 Osteonecrosis, unspecified: Secondary | ICD-10-CM | POA: Insufficient documentation

## 2020-05-21 ENCOUNTER — Telehealth: Payer: Self-pay | Admitting: Family Medicine

## 2020-05-21 NOTE — Telephone Encounter (Signed)
Patient would need an appointment with Dr. [place] first. We can take care of the flu shot & depo shot during the appt.

## 2020-05-21 NOTE — Telephone Encounter (Signed)
Pt is calling in stating that she would like to get her Depo shot but she knows that it is overdue and would need to have labs.  Is it okay to schedule the pt for her depo and flu shot?

## 2020-05-22 NOTE — Telephone Encounter (Signed)
Called pt and she will call the office to get scheduled.  Pt is thinking that she has a appointment set for sometime this month (September) but I am not able to see it on her chart.

## 2020-05-28 ENCOUNTER — Ambulatory Visit: Payer: No Typology Code available for payment source

## 2020-05-30 ENCOUNTER — Other Ambulatory Visit: Payer: Self-pay | Admitting: Family Medicine

## 2020-06-02 ENCOUNTER — Ambulatory Visit: Payer: No Typology Code available for payment source | Admitting: Family Medicine

## 2020-06-05 ENCOUNTER — Other Ambulatory Visit: Payer: No Typology Code available for payment source

## 2020-06-05 ENCOUNTER — Inpatient Hospital Stay: Admission: RE | Admit: 2020-06-05 | Payer: No Typology Code available for payment source | Source: Ambulatory Visit

## 2020-06-09 ENCOUNTER — Ambulatory Visit: Payer: No Typology Code available for payment source | Admitting: Orthopaedic Surgery

## 2020-06-09 ENCOUNTER — Other Ambulatory Visit: Payer: Self-pay

## 2020-06-10 ENCOUNTER — Ambulatory Visit (INDEPENDENT_AMBULATORY_CARE_PROVIDER_SITE_OTHER): Payer: No Typology Code available for payment source | Admitting: Family Medicine

## 2020-06-10 ENCOUNTER — Encounter: Payer: Self-pay | Admitting: Family Medicine

## 2020-06-10 VITALS — BP 110/70 | HR 108 | Ht 65.0 in | Wt 120.0 lb

## 2020-06-10 DIAGNOSIS — M87052 Idiopathic aseptic necrosis of left femur: Secondary | ICD-10-CM

## 2020-06-10 DIAGNOSIS — I10 Essential (primary) hypertension: Secondary | ICD-10-CM | POA: Diagnosis not present

## 2020-06-10 DIAGNOSIS — Z01818 Encounter for other preprocedural examination: Secondary | ICD-10-CM | POA: Diagnosis not present

## 2020-06-10 MED ORDER — GABAPENTIN 100 MG PO CAPS: ORAL_CAPSULE | ORAL | 0 refills | Status: DC

## 2020-06-10 NOTE — Progress Notes (Signed)
HPI: Kathleen Rubio is a 44 y.o. female with hx of migraine,HTN,HLD,and tobacco use disorder here today for pre-op evaluation requested by Dr Lequita Halt. Planning on undergoing  Left total hip arthroplasty. For 1-2 years Kathleen Rubio has had L>R hip pain. Treated as SI joint dysfunction, received SI injection and completed PT with no improvement. Hip X ray done on 05/14/20: 1. Question changes of avascular necrosis in the femoral heads bilaterally. 2. Trace collar osteophytosis along the femoral heads bilaterally.  Left hip arthroplasty planned for 08/19/20 but if could be sooner if there is a pt cancellation.  Kathleen Rubio is taking Ibuprofen for pain.  Problem is getting worse,affecting quality of life. Kathleen Rubio is on Gabapentin 300 mg at bedtime.This med was prescribed initially for hip pain, thinking it was radicular.Kathleen Rubio does not feel like medication is helping with pain.  The only surgery Kathleen Rubio has had is wisdom extraction  When 44 yo under general anesthesia. Negative for any CP,SOB,palpitations,dizziness,or diaphoresis when climbing a flight of stairs,carrying heavy groceries,or walking a hill. No Hx of DM,CAD,CKD,or arrhythmia. + Tobacco use.  HTN: Kathleen Rubio is on Amlodipine 5 mg daily. Negative forsevere/frequent headache, visual changes,claudication, focal weakness, or edema.  Lab Results  Component Value Date   CREATININE 0.50 02/01/2018   BUN 13 02/01/2018   NA 139 02/01/2018   K 3.6 02/01/2018   CL 102 02/01/2018   CO2 26 02/01/2018    Review of Systems  Constitutional: Negative for activity change, appetite change, fatigue and fever.  HENT: Negative for mouth sores, nosebleeds and sore throat.   Eyes: Negative for redness and visual disturbance.  Respiratory: Negative for cough and wheezing.   Gastrointestinal: Negative for abdominal pain, nausea and vomiting.       Negative for changes in bowel habits.  Genitourinary: Negative for decreased urine volume, dysuria and hematuria.  Musculoskeletal:  Positive for arthralgias and gait problem.  Skin: Negative for rash.  Neurological: Negative for syncope and facial asymmetry.  Hematological: Negative for adenopathy. Does not bruise/bleed easily.  Rest of ROS, see pertinent positives sand negatives in HPI  Current Outpatient Medications on File Prior to Visit  Medication Sig Dispense Refill  . amitriptyline (ELAVIL) 50 MG tablet Take by mouth.    Marland Kitchen amLODipine (NORVASC) 5 MG tablet Take 1 tablet (5 mg total) by mouth daily. 90 tablet 3  . aspirin-acetaminophen-caffeine (EXCEDRIN MIGRAINE) 250-250-65 MG tablet Take 2 tablets by mouth every 6 (six) hours as needed for headache.    . baclofen (LIORESAL) 10 MG tablet baclofen 10 mg tablet    . EPINEPHrine 0.3 mg/0.3 mL IJ SOAJ injection Inject 0.3 mg into the muscle as needed for anaphylaxis.    . fenofibrate 54 MG tablet TAKE 1 TABLET BY MOUTH EVERY DAY **NEED APPT** 90 tablet 0  . medroxyPROGESTERone (DEPO-PROVERA) 150 MG/ML injection Inject 1 mL (150 mg total) into the muscle every 3 (three) months. 1 mL 11  . valACYclovir (VALTREX) 1000 MG tablet TAKE 1 TABLET (1,000 MG TOTAL) BY MOUTH 2 (TWO) TIMES DAILY. 90 tablet 1  . zonisamide (ZONEGRAN) 25 MG capsule zonisamide 25 mg capsule     No current facility-administered medications on file prior to visit.   Past Medical History:  Diagnosis Date  . Hyperlipidemia   . Hypertension    No Known Allergies  Past Surgical History:  Procedure Laterality Date  . WISDOM TOOTH EXTRACTION      Social History   Socioeconomic History  . Marital status: Single    Spouse  name: Not on file  . Number of children: Not on file  . Years of education: Not on file  . Highest education level: Not on file  Occupational History  . Not on file  Tobacco Use  . Smoking status: Current Every Day Smoker  . Smokeless tobacco: Never Used  . Tobacco comment: smokes 1pack every 2-4 days  Substance and Sexual Activity  . Alcohol use: Yes    Alcohol/week:  4.0 standard drinks    Types: 4 Standard drinks or equivalent per week    Comment: 2 glasses of wine per week  . Drug use: No  . Sexual activity: Yes    Partners: Male    Birth control/protection: Injection    Comment: Depo Provera  Other Topics Concern  . Not on file  Social History Narrative  . Not on file   Social Determinants of Health   Financial Resource Strain:   . Difficulty of Paying Living Expenses: Not on file  Food Insecurity:   . Worried About Programme researcher, broadcasting/film/video in the Last Year: Not on file  . Ran Out of Food in the Last Year: Not on file  Transportation Needs:   . Lack of Transportation (Medical): Not on file  . Lack of Transportation (Non-Medical): Not on file  Physical Activity:   . Days of Exercise per Week: Not on file  . Minutes of Exercise per Session: Not on file  Stress:   . Feeling of Stress : Not on file  Social Connections:   . Frequency of Communication with Friends and Family: Not on file  . Frequency of Social Gatherings with Friends and Family: Not on file  . Attends Religious Services: Not on file  . Active Member of Clubs or Organizations: Not on file  . Attends Banker Meetings: Not on file  . Marital Status: Not on file    Vitals:   06/10/20 1036  BP: 110/70  Pulse: (!) 108  SpO2: 98%   Body mass index is 19.97 kg/m.  Physical Exam Vitals and nursing note reviewed.  Constitutional:      General: Kathleen Rubio is not in acute distress.    Appearance: Kathleen Rubio is well-developed and normal weight.  HENT:     Head: Normocephalic and atraumatic.     Mouth/Throat:     Mouth: Mucous membranes are moist.     Pharynx: Oropharynx is clear.  Eyes:     Conjunctiva/sclera: Conjunctivae normal.     Pupils: Pupils are equal, round, and reactive to light.  Cardiovascular:     Rate and Rhythm: Normal rate and regular rhythm.     Pulses:          Dorsalis pedis pulses are 2+ on the right side and 2+ on the left side.     Heart sounds: No  murmur heard.   Pulmonary:     Effort: Pulmonary effort is normal. No respiratory distress.     Breath sounds: Normal breath sounds.  Abdominal:     Palpations: Abdomen is soft. There is no hepatomegaly or mass.     Tenderness: There is no abdominal tenderness.  Musculoskeletal:     Left hip: Decreased range of motion.     Comments: Limping, antalgic gait.  Lymphadenopathy:     Cervical: No cervical adenopathy.  Skin:    General: Skin is warm.     Findings: No erythema or rash.  Neurological:     General: No focal deficit present.  Mental Status: Kathleen Rubio is alert and oriented to person, place, and time.     Cranial Nerves: No cranial nerve deficit.  Psychiatric:        Mood and Affect: Mood and affect normal.     Comments: Well groomed, good eye contact.   ASSESSMENT AND PLAN:  Kathleen Rubio was seen today for pre-op evaluation.  Orders Placed This Encounter  Procedures  . EKG 12-Lead   Pre-operative clearance DVT prophylaxis: Early ambulation,consider oral anticoagulation. Smoking cessation is strongly recommended Stop NSAID's and OTC supplements. EKG done today: SR,? IVCD. Avoid medications that can cause QT prolongation. Other pre-op labs will be done next week, when Kathleen Rubio has her CPE. Continue antihypertensive and cholesterol medication. Form will be completed and fax to Dr Lequita Halt with copy of note.  Avascular necrosis of bone of hip, left (HCC) Affecting quality of life. I am not anticipating any significant abnormality that will preclude her from having hip replacement.  Gabapentin is not helping with hip pain, so Kathleen Rubio agrees with start weaning medication off as tolerated.  -     gabapentin (NEURONTIN) 100 MG capsule; Start weaning med off as intructed and as tolerated.  Essential hypertension  BP adequately controlled. No changes in current management.  Return if symptoms worsen or fail to improve, for Keep next appt..   Ehan Freas G. [place], MD  [facility]. [place] office.   A few things to remember from today's visit:   Pre-operative clearance - Plan: EKG 12-Lead  Left hip pain  We are going to start weaning off Gabapentin off. Gabapentin 100 mg caps to take 2 at bedtime for 7 days, then 1 caps for 7 days,then every other day and stop.   If you need refills please call your pharmacy. Do not use My Chart to request refills or for acute issues that need immediate attention.    Please be sure medication list is accurate. If a new problem present, please set up appointment sooner than planned today.

## 2020-06-10 NOTE — Patient Instructions (Addendum)
A few things to remember from today's visit:   Pre-operative clearance - Plan: EKG 12-Lead  Left hip pain  We are going to start weaning off Gabapentin off. Gabapentin 100 mg caps to take 2 at bedtime for 7 days, then 1 caps for 7 days,then every other day and stop.   If you need refills please call your pharmacy. Do not use My Chart to request refills or for acute issues that need immediate attention.    Please be sure medication list is accurate. If a new problem present, please set up appointment sooner than planned today.

## 2020-06-16 ENCOUNTER — Ambulatory Visit (INDEPENDENT_AMBULATORY_CARE_PROVIDER_SITE_OTHER): Payer: No Typology Code available for payment source | Admitting: Family Medicine

## 2020-06-16 ENCOUNTER — Other Ambulatory Visit: Payer: Self-pay

## 2020-06-16 ENCOUNTER — Encounter: Payer: Self-pay | Admitting: Family Medicine

## 2020-06-16 VITALS — BP 118/70 | HR 100 | Temp 98.2°F | Resp 12 | Ht 65.0 in | Wt 120.0 lb

## 2020-06-16 DIAGNOSIS — Z1329 Encounter for screening for other suspected endocrine disorder: Secondary | ICD-10-CM

## 2020-06-16 DIAGNOSIS — Z3042 Encounter for surveillance of injectable contraceptive: Secondary | ICD-10-CM

## 2020-06-16 DIAGNOSIS — Z1159 Encounter for screening for other viral diseases: Secondary | ICD-10-CM | POA: Diagnosis not present

## 2020-06-16 DIAGNOSIS — Z01818 Encounter for other preprocedural examination: Secondary | ICD-10-CM | POA: Diagnosis not present

## 2020-06-16 DIAGNOSIS — I10 Essential (primary) hypertension: Secondary | ICD-10-CM

## 2020-06-16 DIAGNOSIS — E782 Mixed hyperlipidemia: Secondary | ICD-10-CM

## 2020-06-16 DIAGNOSIS — Z13228 Encounter for screening for other metabolic disorders: Secondary | ICD-10-CM

## 2020-06-16 DIAGNOSIS — Z130 Encounter for screening for diseases of the blood and blood-forming organs and certain disorders involving the immune mechanism: Secondary | ICD-10-CM

## 2020-06-16 DIAGNOSIS — Z0000 Encounter for general adult medical examination without abnormal findings: Secondary | ICD-10-CM

## 2020-06-16 DIAGNOSIS — Z23 Encounter for immunization: Secondary | ICD-10-CM

## 2020-06-16 LAB — POCT URINE PREGNANCY: Preg Test, Ur: NEGATIVE

## 2020-06-16 MED ORDER — MEDROXYPROGESTERONE ACETATE 150 MG/ML IM SUSP
150.0000 mg | Freq: Once | INTRAMUSCULAR | Status: AC
  Administered 2020-06-16: 150 mg via INTRAMUSCULAR

## 2020-06-16 NOTE — Progress Notes (Signed)
HPI: Kathleen Rubio is a 44 y.o. female, who is here today for her routine physical.  Last CPE: 02/01/18.  Regular exercise 3 or more time per week: Not consistently due to chronic hip pain. Following a healthy diet: She tries but she acknowledge there is room for improvement. She lives alone.  Family is in [place].  Chronic medical problems: Tobacco use,hip pain, migraines,HVS I, HTN,and hyperlipidemia among some.  Pap smear: 02/01/2018 negative for malignancy, HPV screening was not done. Hx of abnormal pap smears: Negative Hx of STD's: Negative. She is not sexually active. She requested Depo Provera injection, so she will not have her menstrual period around the time she is having hip surgery.  Immunization History  Administered Date(s) Administered  . Hep A / Hep B 08/16/2011, 09/15/2011, 03/10/2012  . Influenza Split 08/16/2011, 07/17/2012, 07/18/2012  . Influenza,inj,Quad PF,6+ Mos 06/29/2013, 05/31/2014, 06/20/2015, 06/29/2016, 06/10/2017, 09/19/2018, 05/23/2019, 06/16/2020  . PFIZER SARS-COV-2 Vaccination 12/14/2019, 01/15/2020  . Tdap 12/17/2010  . Zoster 06/29/2013   Mammogram: 8/20/21Bi-Rads 2 Colonoscopy: N/A DEXA: N/A  Hep C screening: Never  She has no new concerns today.  She was last seen on 06/10/2020 for preop evaluation.  Today we are ordering labs requested by Dr. Lequita Halt. EKG is done last visit prolonged QT. She has not noted any CP, dyspnea, palpitation, diaphoresis with physical activity, or syncope. She is trying to decrease cigarettes per day, currently she is smoking 3 daily. Drinking alcohol a few times per week, more since COVID 19 pandemia started.  HLD: She is on Fenofibrate 54 mg daily.  Component     Latest Ref Rng & Units 02/01/2018  Cholesterol     <200 mg/dL 299 (H)  Triglycerides     <150 mg/dL 371.6 Triglyceride is over 400; calculations on Lipids are invalid. (H)  HDL Cholesterol     > OR = 50 mg/dL 967.89  Total CHOL/HDL Ratio      <5.0 (calc) 2   HTN: She is on Amlodipine 5 mg daily.  Review of Systems  Constitutional: Negative for appetite change, diaphoresis, fever and unexpected weight change.  HENT: Negative for hearing loss, mouth sores, sore throat and trouble swallowing.   Eyes: Negative for redness and visual disturbance.  Respiratory: Negative for cough, shortness of breath and wheezing.   Cardiovascular: Negative for chest pain and leg swelling.  Gastrointestinal: Negative for abdominal pain, nausea and vomiting.       No changes in bowel habits.  Endocrine: Negative for cold intolerance, heat intolerance, polydipsia, polyphagia and polyuria.  Genitourinary: Negative for decreased urine volume, dysuria, hematuria, vaginal bleeding and vaginal discharge.  Musculoskeletal: Positive for arthralgias and gait problem.  Skin: Negative for color change and rash.  Allergic/Immunologic: Negative for environmental allergies.  Neurological: Negative for syncope, weakness and headaches.  Hematological: Negative for adenopathy. Does not bruise/bleed easily.  Psychiatric/Behavioral: Negative for confusion and sleep disturbance. The patient is not nervous/anxious.   All other systems reviewed and are negative.  Current Outpatient Medications on File Prior to Visit  Medication Sig Dispense Refill  . amitriptyline (ELAVIL) 50 MG tablet Take by mouth.    Marland Kitchen amLODipine (NORVASC) 5 MG tablet Take 1 tablet (5 mg total) by mouth daily. 90 tablet 3  . aspirin-acetaminophen-caffeine (EXCEDRIN MIGRAINE) 250-250-65 MG tablet Take 2 tablets by mouth every 6 (six) hours as needed for headache.    . baclofen (LIORESAL) 10 MG tablet baclofen 10 mg tablet    . EPINEPHrine 0.3 mg/0.3 mL IJ  SOAJ injection Inject 0.3 mg into the muscle as needed for anaphylaxis.    . fenofibrate 54 MG tablet TAKE 1 TABLET BY MOUTH EVERY DAY **NEED APPT** 90 tablet 0  . gabapentin (NEURONTIN) 100 MG capsule Start weaning med off as intructed and as  tolerated. 45 capsule 0  . medroxyPROGESTERone (DEPO-PROVERA) 150 MG/ML injection Inject 1 mL (150 mg total) into the muscle every 3 (three) months. 1 mL 11  . valACYclovir (VALTREX) 1000 MG tablet TAKE 1 TABLET (1,000 MG TOTAL) BY MOUTH 2 (TWO) TIMES DAILY. 90 tablet 1  . zonisamide (ZONEGRAN) 25 MG capsule zonisamide 25 mg capsule     No current facility-administered medications on file prior to visit.   Past Medical History:  Diagnosis Date  . Hyperlipidemia   . Hypertension    Past Surgical History:  Procedure Laterality Date  . WISDOM TOOTH EXTRACTION      No Known Allergies  Family History  Problem Relation Age of Onset  . Breast cancer Mother 46       recurrence at age 47  . Cancer Mother 54       head/neck ca  . Diabetes Father        type 2  . Prostate cancer Father 79       currently 85  . Hyperlipidemia Father   . Breast cancer Maternal Grandmother        Dx 7s; deceased 8s    Social History   Socioeconomic History  . Marital status: Single    Spouse name: Not on file  . Number of children: Not on file  . Years of education: Not on file  . Highest education level: Not on file  Occupational History  . Not on file  Tobacco Use  . Smoking status: Current Every Day Smoker  . Smokeless tobacco: Never Used  . Tobacco comment: smokes 1pack every 2-4 days  Substance and Sexual Activity  . Alcohol use: Yes    Alcohol/week: 4.0 standard drinks    Types: 4 Standard drinks or equivalent per week    Comment: 2 glasses of wine per week  . Drug use: No  . Sexual activity: Yes    Partners: Male    Birth control/protection: Injection    Comment: Depo Provera  Other Topics Concern  . Not on file  Social History Narrative  . Not on file   Social Determinants of Health   Financial Resource Strain:   . Difficulty of Paying Living Expenses: Not on file  Food Insecurity:   . Worried About Programme researcher, broadcasting/film/video in the Last Year: Not on file  . Ran Out of Food  in the Last Year: Not on file  Transportation Needs:   . Lack of Transportation (Medical): Not on file  . Lack of Transportation (Non-Medical): Not on file  Physical Activity:   . Days of Exercise per Week: Not on file  . Minutes of Exercise per Session: Not on file  Stress:   . Feeling of Stress : Not on file  Social Connections:   . Frequency of Communication with Friends and Family: Not on file  . Frequency of Social Gatherings with Friends and Family: Not on file  . Attends Religious Services: Not on file  . Active Member of Clubs or Organizations: Not on file  . Attends Banker Meetings: Not on file  . Marital Status: Not on file    Vitals:   06/16/20 0839  BP: 118/70  Pulse:  100  Resp: 12  Temp: 98.2 F (36.8 C)  SpO2: 99%   Body mass index is 19.97 kg/m.  Wt Readings from Last 3 Encounters:  06/16/20 120 lb (54.4 kg)  06/10/20 120 lb (54.4 kg)  05/23/19 122 lb 4 oz (55.5 kg)    Physical Exam Vitals and nursing note reviewed.  Constitutional:      General: She is not in acute distress.    Appearance: She is well-developed and normal weight.  HENT:     Head: Normocephalic and atraumatic.     Right Ear: Hearing, tympanic membrane, ear canal and external ear normal.     Left Ear: Hearing, tympanic membrane, ear canal and external ear normal.     Mouth/Throat:     Mouth: Mucous membranes are moist.     Pharynx: Oropharynx is clear. Uvula midline.  Eyes:     Extraocular Movements: Extraocular movements intact.     Conjunctiva/sclera: Conjunctivae normal.     Pupils: Pupils are equal, round, and reactive to light.  Neck:     Thyroid: No thyromegaly.     Trachea: No tracheal deviation.  Cardiovascular:     Rate and Rhythm: Normal rate and regular rhythm.     Pulses:          Dorsalis pedis pulses are 2+ on the right side and 2+ on the left side.     Heart sounds: No murmur heard.   Pulmonary:     Effort: Pulmonary effort is normal. No  respiratory distress.     Breath sounds: Normal breath sounds.  Abdominal:     Palpations: Abdomen is soft. There is no hepatomegaly or mass.     Tenderness: There is no abdominal tenderness.  Genitourinary:    Comments: Breast: No masses, skin abnormalities, or nipple discharge appreciated bilateral. Musculoskeletal:     Comments: No signs of synovitis appreciated. Antalgic gait.  Lymphadenopathy:     Cervical: No cervical adenopathy.     Upper Body:     Right upper body: No supraclavicular or axillary adenopathy.     Left upper body: No supraclavicular or axillary adenopathy.  Skin:    General: Skin is warm.     Findings: No erythema or rash.  Neurological:     General: No focal deficit present.     Mental Status: She is alert and oriented to person, place, and time.     Cranial Nerves: No cranial nerve deficit.     Deep Tendon Reflexes:     Reflex Scores:      Bicep reflexes are 2+ on the right side and 2+ on the left side.      Patellar reflexes are 2+ on the right side and 2+ on the left side. Psychiatric:        Mood and Affect: Mood and affect normal.     Comments: Well groomed, good eye contact.   ASSESSMENT AND PLAN:  Ms. Kriston Mckinnie was here today annual physical examination.  Orders Placed This Encounter  Procedures  . DG Chest 2 View  . Flu Vaccine QUAD 36+ mos IM  . Hepatitis C antibody screen  . COMPLETE METABOLIC PANEL WITH GFR  . CBC  . Hemoglobin A1c  . Protime-INR  . Urinalysis, Routine w reflex microscopic  . Lipid panel  . TSH  . POCT urine pregnancy   Lab Results  Component Value Date   TSH 0.65 06/16/2020   Lab Results  Component Value Date   HGBA1C 4.8  06/16/2020   Lab Results  Component Value Date   CHOL 286 (H) 06/16/2020   HDL 140 06/16/2020   LDLCALC 115 (H) 06/16/2020   LDLDIRECT 132.0 02/01/2018   TRIG 184 (H) 06/16/2020   CHOLHDL 2.0 06/16/2020   Lab Results  Component Value Date   WBC 8.1 06/16/2020   HGB 14.4 06/16/2020    HCT 41.7 06/16/2020   MCV 102.5 (H) 06/16/2020   PLT 374 06/16/2020   Lab Results  Component Value Date   ALT 5 (L) 06/16/2020   AST 11 06/16/2020   ALKPHOS 47 02/01/2018   BILITOT 0.5 06/16/2020   Lab Results  Component Value Date   CREATININE 0.63 06/16/2020   BUN 7 06/16/2020   NA 137 06/16/2020   K 3.5 06/16/2020   CL 99 06/16/2020   CO2 22 06/16/2020   Routine general medical examination at a health care facility We discussed the importance of regular physical activity and healthy diet for prevention of chronic illness and/or complications. Preventive guidelines reviewed. Vaccination up to date. Next CPE in a year.  Encounter for management and injection of injectable progestin contraceptive -     POCT urine pregnancy -     medroxyPROGESTERone (DEPO-PROVERA) injection 150 mg  Need for influenza vaccination -     Flu Vaccine QUAD 36+ mos IM  Encounter for HCV screening test for low risk patient -     Hepatitis C antibody screen  Pre-operative clearance Form will be completed. Strongly recommend smoking cessation,adverse effects discussed.  Screening for endocrine, metabolic and immunity disorder -     COMPLETE METABOLIC PANEL WITH GFR; Future -     Hemoglobin A1c; Future -     Hemoglobin A1c -     COMPLETE METABOLIC PANEL WITH GFR  Essential hypertension BP adequately controlled. Continue Amlodipine 5 mg daily. Monitor BP at home.  Hyperlipemia, mixed Continue Fenofibrate 74 mg daily. Decrease alcohol intake. Further recommendations according to lab results.   Return in 1 year (on 06/16/2021) for cpe and f/u.   Betty G. [place], MD  [facility]. [place] [facility].   Today you have you routine preventive visit. A few things to remember from today's visit:   Routine general medical examination at a health care facility  Encounter for management and injection of injectable progestin contraceptive - Plan: POCT urine pregnancy,  medroxyPROGESTERone (DEPO-PROVERA) injection 150 mg  Need for influenza vaccination - Plan: Flu Vaccine QUAD 36+ mos IM  Encounter for HCV screening test for low risk patient - Plan: Hepatitis C antibody screen  Pre-operative clearance - Plan: CBC, Protime-INR, Urinalysis, Routine w reflex microscopic  Screening for endocrine, metabolic and immunity disorder - Plan: COMPLETE METABOLIC PANEL WITH GFR, Hemoglobin A1c  Essential hypertension - Plan: TSH  Hyperlipemia, mixed - Plan: Lipid panel  If you need refills please call your pharmacy. Do not use My Chart to request refills or for acute issues that need immediate attention.   Please be sure medication list is accurate. If a new problem present, please set up appointment sooner than planned today.  At least 150 minutes of moderate exercise per week, daily brisk walking for 15-30 min is a good exercise option. Healthy diet low in saturated (animal) fats and sweets and consisting of fresh fruits and vegetables, lean meats such as fish and white chicken and whole grains.  These are some of recommendations for screening depending of age and risk factors:  - Vaccines:  Tdap vaccine every 10 years.  Shingles vaccine recommended at age 44, could be given after 44 years of age but not sure about insurance coverage.   Pneumonia vaccines: Pneumovax at 65. Sometimes Pneumovax is giving earlier if history of smoking, lung disease,diabetes,kidney disease among some.  Screening for diabetes at age 44 and every 3 years.  Cervical cancer prevention:  Pap smear starts at 44 years of age and continues periodically until 44 years old in low risk women. Pap smear every 3 years between 9321 and 44 years old. Pap smear every 3-5 years between women 30 and older if pap smear negative and HPV screening negative.   -Breast cancer: Mammogram: There is disagreement between experts about when to start screening in low risk asymptomatic female but recent  recommendations are to start screening at 3640 and not later than 44 years old , every 1-2 years and after 44 yo q 2 years. Screening is recommended until 44 years old but some women can continue screening depending of healthy issues.  Colon cancer screening: Has been recently changed to 44 yo. Insurance may not cover until you are 44 years old. Screening is recommended until 44 years old.  Cholesterol disorder screening at age 44 and every 3 years.N/A  Also recommended:  1. Dental visit- Brush and floss your teeth twice daily; visit your dentist twice a year. 2. Eye doctor- Get an eye exam at least every 2 years. 3. Helmet use- Always wear a helmet when riding a bicycle, motorcycle, rollerblading or skateboarding. 4. Safe sex- If you may be exposed to sexually transmitted infections, use a condom. 5. Seat belts- Seat belts can save your live; always wear one. 6. Smoke/Carbon Monoxide detectors- These detectors need to be installed on the appropriate level of your home. Replace batteries at least once a year. 7. Skin cancer- When out in the sun please cover up and use sunscreen 15 SPF or higher. 8. Violence- If anyone is threatening or hurting you, please tell your healthcare provider.  9. Drink alcohol in moderation- Limit alcohol intake to one drink or less per day. Never drink and drive. 10. Calcium supplementation 1000 to 1200 mg daily, ideally through your diet.  Vitamin D supplementation 800 units daily.

## 2020-06-16 NOTE — Patient Instructions (Addendum)
Today you have you routine preventive visit. A few things to remember from today's visit:   Routine general medical examination at a health care facility  Encounter for management and injection of injectable progestin contraceptive - Plan: POCT urine pregnancy, medroxyPROGESTERone (DEPO-PROVERA) injection 150 mg  Need for influenza vaccination - Plan: Flu Vaccine QUAD 36+ mos IM  Encounter for HCV screening test for low risk patient - Plan: Hepatitis C antibody screen  Pre-operative clearance - Plan: CBC, Protime-INR, Urinalysis, Routine w reflex microscopic  Screening for endocrine, metabolic and immunity disorder - Plan: COMPLETE METABOLIC PANEL WITH GFR, Hemoglobin A1c  Essential hypertension - Plan: TSH  Hyperlipemia, mixed - Plan: Lipid panel  If you need refills please call your pharmacy. Do not use My Chart to request refills or for acute issues that need immediate attention.   Please be sure medication list is accurate. If a new problem present, please set up appointment sooner than planned today.  At least 150 minutes of moderate exercise per week, daily brisk walking for 15-30 min is a good exercise option. Healthy diet low in saturated (animal) fats and sweets and consisting of fresh fruits and vegetables, lean meats such as fish and white chicken and whole grains.  These are some of recommendations for screening depending of age and risk factors:  - Vaccines:  Tdap vaccine every 10 years.  Shingles vaccine recommended at age 35, could be given after 44 years of age but not sure about insurance coverage.   Pneumonia vaccines: Pneumovax at 65. Sometimes Pneumovax is giving earlier if history of smoking, lung disease,diabetes,kidney disease among some.  Screening for diabetes at age 28 and every 3 years.  Cervical cancer prevention:  Pap smear starts at 44 years of age and continues periodically until 44 years old in low risk women. Pap smear every 3 years between  57 and 18 years old. Pap smear every 3-5 years between women 30 and older if pap smear negative and HPV screening negative.   -Breast cancer: Mammogram: There is disagreement between experts about when to start screening in low risk asymptomatic female but recent recommendations are to start screening at 9 and not later than 44 years old , every 1-2 years and after 44 yo q 2 years. Screening is recommended until 44 years old but some women can continue screening depending of healthy issues.  Colon cancer screening: Has been recently changed to 44 yo. Insurance may not cover until you are 44 years old. Screening is recommended until 44 years old.  Cholesterol disorder screening at age 44 and every 3 years.N/A  Also recommended:  1. Dental visit- Brush and floss your teeth twice daily; visit your dentist twice a year. 2. Eye doctor- Get an eye exam at least every 2 years. 3. Helmet use- Always wear a helmet when riding a bicycle, motorcycle, rollerblading or skateboarding. 4. Safe sex- If you may be exposed to sexually transmitted infections, use a condom. 5. Seat belts- Seat belts can save your live; always wear one. 6. Smoke/Carbon Monoxide detectors- These detectors need to be installed on the appropriate level of your home. Replace batteries at least once a year. 7. Skin cancer- When out in the sun please cover up and use sunscreen 15 SPF or higher. 8. Violence- If anyone is threatening or hurting you, please tell your healthcare provider.  9. Drink alcohol in moderation- Limit alcohol intake to one drink or less per day. Never drink and drive. 10. Calcium supplementation 1000 to  1200 mg daily, ideally through your diet.  Vitamin D supplementation 800 units daily.

## 2020-06-17 LAB — COMPLETE METABOLIC PANEL WITH GFR
AG Ratio: 1.9 (calc) (ref 1.0–2.5)
ALT: 5 U/L — ABNORMAL LOW (ref 6–29)
AST: 11 U/L (ref 10–30)
Albumin: 4.5 g/dL (ref 3.6–5.1)
Alkaline phosphatase (APISO): 48 U/L (ref 31–125)
BUN: 7 mg/dL (ref 7–25)
CO2: 22 mmol/L (ref 20–32)
Calcium: 9.7 mg/dL (ref 8.6–10.2)
Chloride: 99 mmol/L (ref 98–110)
Creat: 0.63 mg/dL (ref 0.50–1.10)
GFR, Est African American: 126 mL/min/{1.73_m2} (ref >=60)
GFR, Est Non African American: 109 mL/min/{1.73_m2} (ref >=60)
Globulin: 2.4 g/dL (calc) (ref 1.9–3.7)
Glucose, Bld: 102 mg/dL — ABNORMAL HIGH (ref 65–99)
Potassium: 3.5 mmol/L (ref 3.5–5.3)
Sodium: 137 mmol/L (ref 135–146)
Total Bilirubin: 0.5 mg/dL (ref 0.2–1.2)
Total Protein: 6.9 g/dL (ref 6.1–8.1)

## 2020-06-17 LAB — HEMOGLOBIN A1C
Hgb A1c MFr Bld: 4.8 % of total Hgb (ref <5.7)
Mean Plasma Glucose: 91 (calc)
eAG (mmol/L): 5 (calc)

## 2020-06-17 LAB — PROTIME-INR
INR: 1
Prothrombin Time: 10.2 s (ref 9.0–11.5)

## 2020-06-17 LAB — CBC
HCT: 41.7 % (ref 35.0–45.0)
Hemoglobin: 14.4 g/dL (ref 11.7–15.5)
MCH: 35.4 pg — ABNORMAL HIGH (ref 27.0–33.0)
MCHC: 34.5 g/dL (ref 32.0–36.0)
MCV: 102.5 fL — ABNORMAL HIGH (ref 80.0–100.0)
MPV: 9.3 fL (ref 7.5–12.5)
Platelets: 374 10*3/uL (ref 140–400)
RBC: 4.07 10*6/uL (ref 3.80–5.10)
RDW: 12 % (ref 11.0–15.0)
WBC: 8.1 10*3/uL (ref 3.8–10.8)

## 2020-06-17 LAB — LIPID PANEL
Cholesterol: 286 mg/dL — ABNORMAL HIGH (ref <200)
HDL: 140 mg/dL (ref >=50)
LDL Cholesterol (Calc): 115 mg/dL (calc) — ABNORMAL HIGH
Non-HDL Cholesterol (Calc): 146 mg/dL (calc) — ABNORMAL HIGH (ref <130)
Total CHOL/HDL Ratio: 2 (calc) (ref <5.0)
Triglycerides: 184 mg/dL — ABNORMAL HIGH (ref <150)

## 2020-06-17 LAB — URINALYSIS, ROUTINE W REFLEX MICROSCOPIC
Bilirubin Urine: NEGATIVE
Glucose, UA: NEGATIVE
Hgb urine dipstick: NEGATIVE
Leukocytes,Ua: NEGATIVE
Nitrite: NEGATIVE
Protein, ur: NEGATIVE
Specific Gravity, Urine: 1.019 (ref 1.001–1.03)
pH: 5.5 (ref 5.0–8.0)

## 2020-06-17 LAB — TSH: TSH: 0.65 mIU/L

## 2020-06-17 LAB — HEPATITIS C ANTIBODY
Hepatitis C Ab: NONREACTIVE
SIGNAL TO CUT-OFF: 0.01 (ref <1.00)

## 2020-06-25 ENCOUNTER — Other Ambulatory Visit: Payer: Self-pay

## 2020-06-25 ENCOUNTER — Ambulatory Visit (INDEPENDENT_AMBULATORY_CARE_PROVIDER_SITE_OTHER)
Admission: RE | Admit: 2020-06-25 | Discharge: 2020-06-25 | Disposition: A | Discharge status: Home / Self Care | Payer: No Typology Code available for payment source | Source: Ambulatory Visit | Attending: Family Medicine | Admitting: Family Medicine

## 2020-06-25 DIAGNOSIS — Z01818 Encounter for other preprocedural examination: Secondary | ICD-10-CM | POA: Diagnosis not present

## 2020-06-27 ENCOUNTER — Encounter: Payer: Self-pay | Admitting: Family Medicine

## 2020-06-27 DIAGNOSIS — I10 Essential (primary) hypertension: Secondary | ICD-10-CM

## 2020-06-27 MED ORDER — AMLODIPINE BESYLATE 5 MG PO TABS: 5.0000 mg | ORAL_TABLET | Freq: Every day | ORAL | 3 refills | Status: DC

## 2020-07-04 ENCOUNTER — Telehealth (INDEPENDENT_AMBULATORY_CARE_PROVIDER_SITE_OTHER): Payer: No Typology Code available for payment source | Admitting: Family Medicine

## 2020-07-04 ENCOUNTER — Encounter: Payer: Self-pay | Admitting: Family Medicine

## 2020-07-04 DIAGNOSIS — N938 Other specified abnormal uterine and vaginal bleeding: Secondary | ICD-10-CM | POA: Diagnosis not present

## 2020-07-04 NOTE — Progress Notes (Signed)
Virtual Visit via Video Note I connected with Kathleen Rubio on 07/04/20 by a video enabled telemedicine application and verified that I am speaking with the correct person using two identifiers.  Location patient: home Location provider:work office Persons participating in the virtual visit: patient, provider  I discussed the limitations of evaluation and management by telemedicine and the availability of in person appointments. The patient expressed understanding and agreed to proceed.  Chief Complaint  Patient presents with  . Contraception   HPI: Kathleen Rubio is a pleasant 44 year old female with history of bilateral hip avascular necrosis, tobacco use, hypertension, and hyperlipidemia who has some questions about hormonal therapy to help with menstrual bleeding. About 4 weeks ago she received a Depo-Provera injection, which she requested, so she would not have menstrual bleeding around the time she is having hip surgery. She was on Depo-Provera a year ago, well tolerated. She is not sexually active.  In the past she had some vaginal spotting about 3 to 4 weeks after her first Depo-Provera injection; she wonders if the same will happen at this time.    She is planning on having right total hip replacement on 07/08/2020 and does not want to have vaginal bleeding, she wonders if she can restart oral OCPs. At this time she denies having pelvic pain, vaginal discharge, or vaginal bleeding. She has decreased tobacco use to "almost nothing."  Continue having left hip surgery on 09/09/2020.  ROS: See pertinent positives and negatives per HPI.  Past Medical History:  Diagnosis Date  . Hyperlipidemia   . Hypertension     Past Surgical History:  Procedure Laterality Date  . WISDOM TOOTH EXTRACTION     Family History  Problem Relation Age of Onset  . Breast cancer Mother 73       recurrence at age 64  . Cancer Mother 68       head/neck ca  . Diabetes Father        type 2  . Prostate cancer  Father 52       currently 15  . Hyperlipidemia Father   . Breast cancer Maternal Grandmother        Dx 57s; deceased 22s    Social History   Socioeconomic History  . Marital status: Single    Spouse name: Not on file  . Number of children: Not on file  . Years of education: Not on file  . Highest education level: Not on file  Occupational History  . Not on file  Tobacco Use  . Smoking status: Current Every Day Smoker  . Smokeless tobacco: Never Used  . Tobacco comment: smokes 1pack every 2-4 days  Substance and Sexual Activity  . Alcohol use: Yes    Alcohol/week: 4.0 standard drinks    Types: 4 Standard drinks or equivalent per week    Comment: 2 glasses of wine per week  . Drug use: No  . Sexual activity: Yes    Partners: Male    Birth control/protection: Injection    Comment: Depo Provera  Other Topics Concern  . Not on file  Social History Narrative  . Not on file   Social Determinants of Health   Financial Resource Strain:   . Difficulty of Paying Living Expenses: Not on file  Food Insecurity:   . Worried About Programme researcher, broadcasting/film/video in the Last Year: Not on file  . Ran Out of Food in the Last Year: Not on file  Transportation Needs:   . Lack of  Transportation (Medical): Not on file  . Lack of Transportation (Non-Medical): Not on file  Physical Activity:   . Days of Exercise per Week: Not on file  . Minutes of Exercise per Session: Not on file  Stress:   . Feeling of Stress : Not on file  Social Connections:   . Frequency of Communication with Friends and Family: Not on file  . Frequency of Social Gatherings with Friends and Family: Not on file  . Attends Religious Services: Not on file  . Active Member of Clubs or Organizations: Not on file  . Attends Banker Meetings: Not on file  . Marital Status: Not on file  Intimate Partner Violence:   . Fear of Current or Ex-Partner: Not on file  . Emotionally Abused: Not on file  . Physically Abused:  Not on file  . Sexually Abused: Not on file      Current Outpatient Medications:  .  amitriptyline (ELAVIL) 50 MG tablet, Take by mouth., Disp: , Rfl:  .  amLODipine (NORVASC) 5 MG tablet, Take 1 tablet (5 mg total) by mouth daily., Disp: 90 tablet, Rfl: 3 .  aspirin-acetaminophen-caffeine (EXCEDRIN MIGRAINE) 250-250-65 MG tablet, Take 2 tablets by mouth every 6 (six) hours as needed for headache., Disp: , Rfl:  .  baclofen (LIORESAL) 10 MG tablet, baclofen 10 mg tablet, Disp: , Rfl:  .  EPINEPHrine 0.3 mg/0.3 mL IJ SOAJ injection, Inject 0.3 mg into the muscle as needed for anaphylaxis., Disp: , Rfl:  .  fenofibrate 54 MG tablet, TAKE 1 TABLET BY MOUTH EVERY DAY **NEED APPT**, Disp: 90 tablet, Rfl: 0 .  gabapentin (NEURONTIN) 100 MG capsule, Start weaning med off as intructed and as tolerated., Disp: 45 capsule, Rfl: 0 .  valACYclovir (VALTREX) 1000 MG tablet, TAKE 1 TABLET (1,000 MG TOTAL) BY MOUTH 2 (TWO) TIMES DAILY., Disp: 90 tablet, Rfl: 1 .  zonisamide (ZONEGRAN) 25 MG capsule, zonisamide 25 mg capsule, Disp: , Rfl:   EXAM:  VITALS per patient if applicable:LMP 06/09/2020   GENERAL: alert, oriented, appears well and in no acute distress  HEENT: atraumatic, conjunctiva clear, no obvious abnormalities on inspection.  LUNGS: on inspection no signs of respiratory distress, breathing rate appears normal, no obvious gross SOB, gasping or wheezing  CV: no obvious cyanosis  PSYCH/NEURO: pleasant and cooperative, no obvious depression or anxiety, speech and thought processing grossly intact  ASSESSMENT AND PLAN:  Discussed the following assessment and plan:  DUB (dysfunctional uterine bleeding) She is currently asymptomatic, she has had this problem a few weeks after starting Depo-Provera. There is no way to know if she will develop vaginal pain at this time but given the fact he is undergoing total hip replacement, I do not feel comfortable starting combination OCPs around this  time. We discussed some side effects of OCPs, including thrombotic events, which could be exacerbated by surgery + tobacco use. She will let me know if she has heavy vaginal bleeding 3 to 4 weeks after surgery, in which case we can consider starting OCPs, in this case benefit would outweigh the risk.   I discussed the assessment and treatment plan with the patient. Kathleen Rubio was provided an opportunity to ask questions and all were answered.  She agreed with the plan and demonstrated an understanding of the instructions.  Return if symptoms worsen or fail to improve.    Winter Trefz [place], MD

## 2020-08-11 ENCOUNTER — Other Ambulatory Visit: Payer: Self-pay

## 2020-08-11 ENCOUNTER — Encounter: Payer: Self-pay | Admitting: Family Medicine

## 2020-08-11 ENCOUNTER — Ambulatory Visit (INDEPENDENT_AMBULATORY_CARE_PROVIDER_SITE_OTHER): Payer: No Typology Code available for payment source | Admitting: Family Medicine

## 2020-08-11 VITALS — BP 118/70 | HR 100 | Resp 16 | Ht 65.0 in | Wt 120.0 lb

## 2020-08-11 DIAGNOSIS — I10 Essential (primary) hypertension: Secondary | ICD-10-CM

## 2020-08-11 DIAGNOSIS — Z01818 Encounter for other preprocedural examination: Secondary | ICD-10-CM | POA: Diagnosis not present

## 2020-08-11 NOTE — Patient Instructions (Signed)
A few things to remember from today's visit:   Essential hypertension  Pre-operative clearance - Plan: BASIC METABOLIC PANEL WITH GFR, Urinalysis, Routine w reflex microscopic, Protime-INR, CBC  If you need refills please call your pharmacy. Do not use My Chart to request refills or for acute issues that need immediate attention.   We will be sending lab results to your surgeon.  Please be sure medication list is accurate. If a new problem present, please set up appointment sooner than planned today.

## 2020-08-11 NOTE — Progress Notes (Signed)
Chief Complaint  Patient presents with  . surgery clearance   HPI: Ms.Jackeline Luepke is a 44 y.o. female with history of hypertension, hyperlipidemia, migraine headaches here today for surgical clearance requested by Dr Gerri Spore. I saw her for pre op on 06/10/20, she underwent right hip replacement, 07/08/20.  She is now planning on having left hip total replacement on 09/09/2020. Bilateral hip avascular necrosis, hip pain getting worse for the past 1.5 years.  No complications during or after the procedure.  She has recovered very well and still doing PT. She quit smoking before last surgery, she is not using pharmacologic treatment.  No history of diabetes, CAD, or CKD. Hypertension: Currently on amlodipine 5 mg daily. Hyperlipidemia on fenofibrate 54 mg daily. Negative for chest pain, dyspnea, palpitation, dizziness, or diaphoresis upon climbing a flight of stairs, walking a hill, or carrying heavy groceries.  She is not sure if pre-op labs need to be repeated but wants to go ahead and have them done today in case.  Lab Results  Component Value Date   CREATININE 0.63 06/16/2020   BUN 7 06/16/2020   NA 137 06/16/2020   K 3.5 06/16/2020   CL 99 06/16/2020   CO2 22 06/16/2020    Negative for fever, chills, change in appetite, cough, abdominal pain, or urinary symptoms.  Lab Results  Component Value Date   WBC 8.1 06/16/2020   HGB 14.4 06/16/2020   HCT 41.7 06/16/2020   MCV 102.5 (H) 06/16/2020   PLT 374 06/16/2020   Review of Systems  Constitutional: Negative for activity change, appetite change and fatigue.  HENT: Negative for mouth sores, nosebleeds and sore throat.   Eyes: Negative for redness and visual disturbance.  Respiratory: Negative for chest tightness and wheezing.   Cardiovascular: Negative for leg swelling.  Gastrointestinal: Negative for nausea and vomiting.       Negative for changes in bowel habits.  Genitourinary: Negative for decreased urine volume,  dysuria and hematuria.  Musculoskeletal: Positive for arthralgias.  Neurological: Negative for syncope, weakness and headaches.  Rest see pertinent positives and negatives per HPI.  Current Outpatient Medications on File Prior to Visit  Medication Sig Dispense Refill  . amitriptyline (ELAVIL) 50 MG tablet Take by mouth.    Marland Kitchen amLODipine (NORVASC) 5 MG tablet Take 1 tablet (5 mg total) by mouth daily. 90 tablet 3  . aspirin-acetaminophen-caffeine (EXCEDRIN MIGRAINE) 250-250-65 MG tablet Take 2 tablets by mouth every 6 (six) hours as needed for headache.    . baclofen (LIORESAL) 10 MG tablet baclofen 10 mg tablet    . EPINEPHrine 0.3 mg/0.3 mL IJ SOAJ injection Inject 0.3 mg into the muscle as needed for anaphylaxis.    . fenofibrate 54 MG tablet TAKE 1 TABLET BY MOUTH EVERY DAY **NEED APPT** 90 tablet 0  . gabapentin (NEURONTIN) 100 MG capsule Start weaning med off as intructed and as tolerated. 45 capsule 0  . valACYclovir (VALTREX) 1000 MG tablet TAKE 1 TABLET (1,000 MG TOTAL) BY MOUTH 2 (TWO) TIMES DAILY. 90 tablet 1  . zonisamide (ZONEGRAN) 25 MG capsule zonisamide 25 mg capsule     No current facility-administered medications on file prior to visit.     Past Medical History:  Diagnosis Date  . Hyperlipidemia   . Hypertension    No Known Allergies  Social History   Socioeconomic History  . Marital status: Single    Spouse name: Not on file  . Number of children: Not on file  .  Years of education: Not on file  . Highest education level: Not on file  Occupational History  . Not on file  Tobacco Use  . Smoking status: Former Smoker    Quit date: 06/11/2020    Years since quitting: 0.1  . Smokeless tobacco: Never Used  . Tobacco comment: smokes 1pack every 2-4 days  Substance and Sexual Activity  . Alcohol use: Yes    Alcohol/week: 4.0 standard drinks    Types: 4 Standard drinks or equivalent per week    Comment: 2 glasses of wine per week  . Drug use: No  . Sexual  activity: Yes    Partners: Male    Birth control/protection: Injection    Comment: Depo Provera  Other Topics Concern  . Not on file  Social History Narrative  . Not on file   Social Determinants of Health   Financial Resource Strain:   . Difficulty of Paying Living Expenses: Not on file  Food Insecurity:   . Worried About Programme researcher, broadcasting/film/video in the Last Year: Not on file  . Ran Out of Food in the Last Year: Not on file  Transportation Needs:   . Lack of Transportation (Medical): Not on file  . Lack of Transportation (Non-Medical): Not on file  Physical Activity:   . Days of Exercise per Week: Not on file  . Minutes of Exercise per Session: Not on file  Stress:   . Feeling of Stress : Not on file  Social Connections:   . Frequency of Communication with Friends and Family: Not on file  . Frequency of Social Gatherings with Friends and Family: Not on file  . Attends Religious Services: Not on file  . Active Member of Clubs or Organizations: Not on file  . Attends Banker Meetings: Not on file  . Marital Status: Not on file    Vitals:   08/11/20 0800  BP: 118/70  Pulse: 100  Resp: 16  SpO2: 99%   Body mass index is 19.97 kg/m.  Physical Exam Vitals and nursing note reviewed.  Constitutional:      General: She is not in acute distress.    Appearance: She is well-developed and normal weight.  HENT:     Head: Normocephalic and atraumatic.     Mouth/Throat:     Mouth: Mucous membranes are moist.     Pharynx: Oropharynx is clear.  Eyes:     Conjunctiva/sclera: Conjunctivae normal.     Pupils: Pupils are equal, round, and reactive to light.  Cardiovascular:     Rate and Rhythm: Normal rate and regular rhythm.     Pulses:          Dorsalis pedis pulses are 2+ on the right side and 2+ on the left side.     Heart sounds: No murmur heard.   Pulmonary:     Effort: Pulmonary effort is normal. No respiratory distress.     Breath sounds: Normal breath  sounds.  Abdominal:     Palpations: Abdomen is soft. There is no hepatomegaly or mass.     Tenderness: There is no abdominal tenderness.  Lymphadenopathy:     Cervical: No cervical adenopathy.  Skin:    General: Skin is warm.     Findings: No erythema or rash.  Neurological:     General: No focal deficit present.     Mental Status: She is alert and oriented to person, place, and time.     Cranial Nerves: No cranial  nerve deficit.     Comments: Antalgic gait, not assisted.  Psychiatric:     Comments: Well groomed, good eye contact.    ASSESSMENT AND PLAN:  Ms.Guneet was seen today for surgery clearance.  Diagnoses and all orders for this visit: Orders Placed This Encounter  Procedures  . BASIC METABOLIC PANEL WITH GFR  . Urinalysis, Routine w reflex microscopic  . Protime-INR  . CBC   Lab Results  Component Value Date   WBC 12.6 (H) 08/11/2020   HGB 15.3 08/11/2020   HCT 44.9 08/11/2020   MCV 99.3 08/11/2020   PLT 407 (H) 08/11/2020   Lab Results  Component Value Date   CREATININE 0.58 08/11/2020   BUN 12 08/11/2020   NA 139 08/11/2020   K 4.5 08/11/2020   CL 103 08/11/2020   CO2 21 08/11/2020   Pre-operative clearance S/P right hip total replacement with no complications and recovering well. She already quit smoking,wich is great, and has no significant CV risk factors. DVT prevention: Early ambulation. She knows to stop NSAID's and OTC supplements 10-14 days before surgery. Avoid medications that could aggravate QT prolongation. Low risk for complications. She is cleared for surgery. Labs are pending. Form has already been completed and faxed.  Essential hypertension BP adequately controlled. Continue amlodipine 5 mg daily.  Spent 30 minutes. During this time history was obtained, examination was performed, documentation was completed, prior labs reviewed, and assessment/plan discussed.  Return if symptoms worsen or fail to improve.   Despina Boan G. [place],  MD  [facility]. [place] office.   A few things to remember from today's visit:   Essential hypertension  Pre-operative clearance - Plan: BASIC METABOLIC PANEL WITH GFR, Urinalysis, Routine w reflex microscopic, Protime-INR, CBC  If you need refills please call your pharmacy. Do not use My Chart to request refills or for acute issues that need immediate attention.   We will be sending lab results to your surgeon.  Please be sure medication list is accurate. If a new problem present, please set up appointment sooner than planned today.

## 2020-08-12 LAB — BASIC METABOLIC PANEL WITH GFR
BUN: 12 mg/dL (ref 7–25)
CO2: 21 mmol/L (ref 20–32)
Calcium: 10.8 mg/dL — ABNORMAL HIGH (ref 8.6–10.2)
Chloride: 103 mmol/L (ref 98–110)
Creat: 0.58 mg/dL (ref 0.50–1.10)
GFR, Est African American: 130 mL/min/{1.73_m2} (ref >=60)
GFR, Est Non African American: 112 mL/min/{1.73_m2} (ref >=60)
Glucose, Bld: 104 mg/dL — ABNORMAL HIGH (ref 65–99)
Potassium: 4.5 mmol/L (ref 3.5–5.3)
Sodium: 139 mmol/L (ref 135–146)

## 2020-08-12 LAB — URINALYSIS, ROUTINE W REFLEX MICROSCOPIC
Bacteria, UA: NONE SEEN /HPF
Bilirubin Urine: NEGATIVE
Glucose, UA: NEGATIVE
Hyaline Cast: NONE SEEN /LPF
Ketones, ur: NEGATIVE
Nitrite: NEGATIVE
Protein, ur: NEGATIVE
Specific Gravity, Urine: 1.017 (ref 1.001–1.03)
pH: 7 (ref 5.0–8.0)

## 2020-08-12 LAB — PROTIME-INR
INR: 1
Prothrombin Time: 10.3 s (ref 9.0–11.5)

## 2020-08-12 LAB — CBC
HCT: 44.9 % (ref 35.0–45.0)
Hemoglobin: 15.3 g/dL (ref 11.7–15.5)
MCH: 33.8 pg — ABNORMAL HIGH (ref 27.0–33.0)
MCHC: 34.1 g/dL (ref 32.0–36.0)
MCV: 99.3 fL (ref 80.0–100.0)
MPV: 9.3 fL (ref 7.5–12.5)
Platelets: 407 10*3/uL — ABNORMAL HIGH (ref 140–400)
RBC: 4.52 10*6/uL (ref 3.80–5.10)
RDW: 12.3 % (ref 11.0–15.0)
WBC: 12.6 10*3/uL — ABNORMAL HIGH (ref 3.8–10.8)

## 2020-09-24 ENCOUNTER — Encounter: Payer: Self-pay | Admitting: Family Medicine

## 2020-09-24 ENCOUNTER — Other Ambulatory Visit: Payer: Self-pay

## 2020-09-24 ENCOUNTER — Ambulatory Visit (INDEPENDENT_AMBULATORY_CARE_PROVIDER_SITE_OTHER): Payer: No Typology Code available for payment source | Admitting: Family Medicine

## 2020-09-24 VITALS — BP 124/70 | HR 96 | Temp 98.4°F | Resp 12 | Ht 65.0 in | Wt 123.0 lb

## 2020-09-24 DIAGNOSIS — L219 Seborrheic dermatitis, unspecified: Secondary | ICD-10-CM

## 2020-09-24 DIAGNOSIS — L989 Disorder of the skin and subcutaneous tissue, unspecified: Secondary | ICD-10-CM | POA: Diagnosis not present

## 2020-09-24 NOTE — Progress Notes (Signed)
Chief Complaint  Patient presents with  . Acute Visit    Patient c/o having a bump on her head x 2 years.    HPI: [name] is a 45 y.o. female, who is here today concerned about scalp lesion as described above. Lesion has been there for a while, at least 2 year. She has not noted significant growth. Sometimes pruritic, there is no pain. Negative for associated fever, alopecia,oral lesions,sore throat,or abnormal wt loss.  She has not tried OTC treatments. Lesion can be irritated sometimes when combing her hair.  Review of Systems  Constitutional: Negative for appetite change and fatigue.  Eyes: Negative for redness and itching.  Gastrointestinal: Negative for abdominal pain, nausea and vomiting.  Musculoskeletal: Positive for arthralgias and gait problem (S/P hip arthroplasty.).  Skin: Negative for rash and wound.  Neurological: Negative for syncope, weakness and headaches.  Rest see pertinent positives and negatives per HPI.  Current Outpatient Medications on File Prior to Visit  Medication Sig Dispense Refill  . amitriptyline (ELAVIL) 50 MG tablet Take by mouth.    Marland Kitchen amLODipine (NORVASC) 5 MG tablet Take 1 tablet (5 mg total) by mouth daily. 90 tablet 3  . amoxicillin (AMOXIL) 500 MG capsule Take by mouth.    Marland Kitchen aspirin-acetaminophen-caffeine (EXCEDRIN MIGRAINE) 250-250-65 MG tablet Take 2 tablets by mouth every 6 (six) hours as needed for headache.    Marland Kitchen EPINEPHrine 0.3 mg/0.3 mL IJ SOAJ injection Inject 0.3 mg into the muscle as needed for anaphylaxis.    . fenofibrate 54 MG tablet TAKE 1 TABLET BY MOUTH EVERY DAY **NEED APPT** 90 tablet 0  . medroxyPROGESTERone (DEPO-PROVERA) 150 MG/ML injection 1 ml    . ondansetron (ZOFRAN) 4 MG tablet Take by mouth.    . oxyCODONE (OXY IR/ROXICODONE) 5 MG immediate release tablet Take by mouth.    . traMADol (ULTRAM) 50 MG tablet Take 50 mg by mouth every 6 (six) hours as needed.    . valACYclovir (VALTREX) 1000 MG tablet TAKE 1  TABLET (1,000 MG TOTAL) BY MOUTH 2 (TWO) TIMES DAILY. 90 tablet 1  . zonisamide (ZONEGRAN) 25 MG capsule zonisamide 25 mg capsule    . baclofen (LIORESAL) 10 MG tablet baclofen 10 mg tablet (Patient not taking: Reported on 09/24/2020)    . [facility] ASPIRIN 325 MG tablet Take by mouth.    . gabapentin (NEURONTIN) 100 MG capsule Start weaning med off as intructed and as tolerated. (Patient not taking: Reported on 09/24/2020) 45 capsule 0   No current facility-administered medications on file prior to visit.    Past Medical History:  Diagnosis Date  . Hyperlipidemia   . Hypertension    No Known Allergies  Social History   Socioeconomic History  . Marital status: Single    Spouse name: Not on file  . Number of children: Not on file  . Years of education: Not on file  . Highest education level: Not on file  Occupational History  . Not on file  Tobacco Use  . Smoking status: Former Smoker    Quit date: 06/11/2020    Years since quitting: 0.2  . Smokeless tobacco: Never Used  . Tobacco comment: smokes 1pack every 2-4 days  Substance and Sexual Activity  . Alcohol use: Yes    Alcohol/week: 4.0 standard drinks    Types: 4 Standard drinks or equivalent per week    Comment: 2 glasses of wine per week  . Drug use: No  . Sexual activity: Yes  Partners: Male    Birth control/protection: Injection    Comment: Depo Provera  Other Topics Concern  . Not on file  Social History Narrative  . Not on file   Social Determinants of Health   Financial Resource Strain: Not on file  Food Insecurity: Not on file  Transportation Needs: Not on file  Physical Activity: Not on file  Stress: Not on file  Social Connections: Not on file    Vitals:   09/24/20 1103 09/24/20 1728  BP: 124/70   Pulse: (!) 115 96  Resp: 12   Temp: 98.4 F (36.9 C)   SpO2: 98%    Body mass index is 20.47 kg/m.  Physical Exam Vitals and nursing note reviewed.  Constitutional:      General: She is not in acute  distress.    Appearance: She is well-developed and normal weight.  HENT:     Head: Normocephalic and atraumatic.      Mouth/Throat:     Mouth: Oropharynx is clear and moist and mucous membranes are normal.  Eyes:     Conjunctiva/sclera: Conjunctivae normal.  Pulmonary:     Effort: Pulmonary effort is normal. No respiratory distress.  Musculoskeletal:        General: No edema.  Lymphadenopathy:     Head:     Right side of head: No submandibular or occipital adenopathy.     Left side of head: No submandibular or occipital adenopathy.     Cervical: No cervical adenopathy.  Skin:    General: Skin is warm.     Comments: Slightly raised on scalp lesions x 2 (parietal) , pinkish,defined borders,1-2 mm on parietal scalp. Not tender, no local heat,or drainage.  Neurological:     Mental Status: She is alert and oriented to person, place, and time.     Comments: Antalgic gait assisted with a cane.  Psychiatric:        Mood and Affect: Mood and affect normal.        Speech: Speech normal.     Comments: Well groomed, good eye contact.   ASSESSMENT AND PLAN:  [name] was seen today for acute visit.  Diagnoses and all orders for this visit:  Skin lesion of scalp Hx and examination today do not suggest a serious process. Lesions are suggestive for seborrheic dermatitis.  Seborrheic dermatitis of scalp Mild. Educated about Dx,prognosis,and treatment options. Topical steroid, Hydrocortisone 1% 1-2 times daily as needed. Dermatologist evaluation could be arranged if problem gets worse.   Return if symptoms worsen or fail to improve.   Samaia Iwata G. [place], MD  [facility]. [place] office.  A few things to remember from today's visit:   Seborrheic dermatitis of scalp  Seborrheic Dermatitis, Adult Seborrheic dermatitis is a skin disease that causes red, scaly patches. It usually occurs on the scalp, and it is often called dandruff. The patches may appear on other parts of  the body. Skin patches tend to appear where there are many oil glands in the skin. Areas of the body that are commonly affected include:  Scalp.  Skin folds of the body.  Ears.  Eyebrows.  Neck.  Face.  Armpits.  The bearded area of men's faces. The condition may come and go for no known reason, and it is often long-lasting (chronic). What are the causes? The cause of this condition is not known. What increases the risk? This condition is more likely to develop in people who:  Have certain conditions, such as: ?  HIV (human immunodeficiency virus). ? AIDS (acquired immunodeficiency syndrome). ? Parkinson disease. ? Mood disorders, such as depression.  Are 50-71 years old. What are the signs or symptoms? Symptoms of this condition include:  Thick scales on the scalp.  Redness on the face or in the armpits.  Skin that is flaky. The flakes may be white or yellow.  Skin that seems oily or dry but is not helped with moisturizers.  Itching or burning in the affected areas. How is this diagnosed? This condition is diagnosed with a medical history and physical exam. A sample of your skin may be tested (skin biopsy). You may need to see a skin specialist (dermatologist). How is this treated? There is no cure for this condition, but treatment can help to manage the symptoms. You may get treatment to remove scales, lower the risk of skin infection, and reduce swelling or itching. Treatment may include:  Creams that reduce swelling and irritation (steroids).  Creams that reduce skin yeast.  Medicated shampoo, soaps, moisturizing creams, or ointments.  Medicated moisturizing creams or ointments. Follow these instructions at home:  Apply over-the-counter and prescription medicines only as told by your health care provider.  Use any medicated shampoo, soaps, skin creams, or ointments only as told by your health care provider.  Keep all follow-up visits as told by your  health care provider. This is important. Contact a health care provider if:  Your symptoms do not improve with treatment.  Your symptoms get worse.  You have new symptoms. This information is not intended to replace advice given to you by your health care provider. Make sure you discuss any questions you have with your health care provider. Document Revised: 08/19/2017 Document Reviewed: 12/25/2015 Elsevier Patient Education  [place].   Please be sure medication list is accurate. If a new problem present, please set up appointment sooner than planned today.

## 2020-09-24 NOTE — Patient Instructions (Addendum)
A few things to remember from today's visit:   Seborrheic dermatitis of scalp  Seborrheic Dermatitis, Adult Seborrheic dermatitis is a skin disease that causes red, scaly patches. It usually occurs on the scalp, and it is often called dandruff. The patches may appear on other parts of the body. Skin patches tend to appear where there are many oil glands in the skin. Areas of the body that are commonly affected include:  Scalp.  Skin folds of the body.  Ears.  Eyebrows.  Neck.  Face.  Armpits.  The bearded area of men's faces. The condition may come and go for no known reason, and it is often long-lasting (chronic). What are the causes? The cause of this condition is not known. What increases the risk? This condition is more likely to develop in people who:  Have certain conditions, such as: ? HIV (human immunodeficiency virus). ? AIDS (acquired immunodeficiency syndrome). ? Parkinson disease. ? Mood disorders, such as depression.  Are 45-80 years old. What are the signs or symptoms? Symptoms of this condition include:  Thick scales on the scalp.  Redness on the face or in the armpits.  Skin that is flaky. The flakes may be white or yellow.  Skin that seems oily or dry but is not helped with moisturizers.  Itching or burning in the affected areas. How is this diagnosed? This condition is diagnosed with a medical history and physical exam. A sample of your skin may be tested (skin biopsy). You may need to see a skin specialist (dermatologist). How is this treated? There is no cure for this condition, but treatment can help to manage the symptoms. You may get treatment to remove scales, lower the risk of skin infection, and reduce swelling or itching. Treatment may include:  Creams that reduce swelling and irritation (steroids).  Creams that reduce skin yeast.  Medicated shampoo, soaps, moisturizing creams, or ointments.  Medicated moisturizing creams or  ointments. Follow these instructions at home:  Apply over-the-counter and prescription medicines only as told by your health care provider.  Use any medicated shampoo, soaps, skin creams, or ointments only as told by your health care provider.  Keep all follow-up visits as told by your health care provider. This is important. Contact a health care provider if:  Your symptoms do not improve with treatment.  Your symptoms get worse.  You have new symptoms. This information is not intended to replace advice given to you by your health care provider. Make sure you discuss any questions you have with your health care provider. Document Revised: 08/19/2017 Document Reviewed: 12/25/2015 Elsevier Patient Education  2020 [employer].   Please be sure medication list is accurate. If a new problem present, please set up appointment sooner than planned today.

## 2020-09-28 ENCOUNTER — Encounter: Payer: Self-pay | Admitting: Family Medicine

## 2020-12-09 ENCOUNTER — Other Ambulatory Visit: Payer: Self-pay | Admitting: Family Medicine

## 2020-12-09 DIAGNOSIS — E782 Mixed hyperlipidemia: Secondary | ICD-10-CM

## 2020-12-14 ENCOUNTER — Emergency Department (HOSPITAL_COMMUNITY)
Admission: EM | Admit: 2020-12-14 | Discharge: 2020-12-14 | Disposition: A | Discharge status: Home / Self Care | Payer: Self-pay | Attending: Emergency Medicine | Admitting: Emergency Medicine

## 2020-12-14 ENCOUNTER — Other Ambulatory Visit: Payer: Self-pay

## 2020-12-14 ENCOUNTER — Encounter (HOSPITAL_COMMUNITY): Payer: Self-pay | Admitting: Emergency Medicine

## 2020-12-14 ENCOUNTER — Emergency Department (HOSPITAL_COMMUNITY): Payer: Self-pay

## 2020-12-14 DIAGNOSIS — Z87891 Personal history of nicotine dependence: Secondary | ICD-10-CM | POA: Insufficient documentation

## 2020-12-14 DIAGNOSIS — W540XXA Bitten by dog, initial encounter: Secondary | ICD-10-CM | POA: Insufficient documentation

## 2020-12-14 DIAGNOSIS — I10 Essential (primary) hypertension: Secondary | ICD-10-CM | POA: Insufficient documentation

## 2020-12-14 DIAGNOSIS — S61451A Open bite of right hand, initial encounter: Secondary | ICD-10-CM | POA: Insufficient documentation

## 2020-12-14 DIAGNOSIS — Z79899 Other long term (current) drug therapy: Secondary | ICD-10-CM | POA: Insufficient documentation

## 2020-12-14 DIAGNOSIS — S61051A Open bite of right thumb without damage to nail, initial encounter: Secondary | ICD-10-CM | POA: Insufficient documentation

## 2020-12-14 MED ORDER — AMOXICILLIN-POT CLAVULANATE 875-125 MG PO TABS: 1.0000 | ORAL_TABLET | Freq: Two times a day (BID) | ORAL | 0 refills | Status: AC

## 2020-12-14 MED ORDER — LIDOCAINE HCL 2 % IJ SOLN
5.0000 mL | Freq: Once | INTRAMUSCULAR | Status: AC
  Administered 2020-12-14: 100 mg
  Filled 2020-12-14: qty 20

## 2020-12-14 NOTE — Discharge Instructions (Addendum)
Please follow-up with your primary care provider in 3 days for wound check.  You will need to have your sutures removed in 7 to 10 days.  I provide you with a referral to hand surgery in the event that you develop any functional limitations.  As of now, your exam is entirely reassuring.  Please take the Augmentin, as directed.  Take first dose tonight.  Please keep the area clean and covered.  Monitor for evidence of infection.  Keep it protected.  Keep the hand elevated to help mitigate swelling.  Ibuprofen 600 mg every 6 hours as needed for pain control.  Please pick this prescription up this evening at the [facility] because they are 24/7 and I want you to be able to take first dose this evening.  Return to the ED or seek immediate attention should experience any worsening symptoms.

## 2020-12-14 NOTE — ED Provider Notes (Signed)
[facility]-EMERGENCY DEPT Provider Note   CSN: [id number] Arrival date & time: 12/14/20  1836     History Chief Complaint  Patient presents with  . Animal Bite    Kathleen Rubio is a 45 y.o. female with no relevant past medical history presents the ED with complaints of dog bite to right hand.  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  She states that they immediately released.  She called her veterinarian who confirmed that they are up-to-date on all of their shots.    Patient is right-hand dominant.  She states that there is a injury over her the base of thumb on the back of her hand.  She went to an urgent care at [facility] and they advised her to come to the ED given her injury.  They had cleaned and irrigated the wound, but stated that she may require higher level of care.    Bleeding is controlled.  She is denying any significant pain symptoms.  She does not have any medical insurance and expresses concern regarding affordability of visit.  She is up-to-date on her tetanus immunization.  She denies any swelling, numbness or weakness, inability to move her thumb, blood thinners or bleeding disorder, diabetes, uncontrolled bleeding, or other injuries.  She states that her pain is entirely well controlled and she is not requesting any further analgesics at this time.  HPI     Past Medical History:  Diagnosis Date  . Hyperlipidemia   . Hypertension     Patient Active Problem List   Diagnosis Date Noted  . Left hip pain 02/01/2018  . Missed period 02/01/2018  . Essential hypertension 11/05/2016  . Routine general medical examination at a health care facility 10/18/2016  . Hyperlipemia, mixed 05/20/2016  . Contraceptive management 05/20/2016  . Tobacco use disorder 05/20/2016  . Urticaria of unknown origin 05/20/2016  . Hypertriglyceridemia  10/13/2015    Past Surgical History:  Procedure Laterality Date  . WISDOM TOOTH EXTRACTION       OB History    Gravida  0   Para  0   Term  0   Preterm  0   AB  0   Living  0     SAB  0   IAB  0   Ectopic  0   Multiple  0   Live Births              Family History  Problem Relation Age of Onset  . Breast cancer Mother 74       recurrence at age 45  . Cancer Mother 57       head/neck ca  . Diabetes Father        type 2  . Prostate cancer Father 59       currently 3  . Hyperlipidemia Father   . Breast cancer Maternal Grandmother        Dx 51s; deceased 63s    Social History   Tobacco Use  . Smoking status: Former Smoker    Quit date: 06/11/2020    Years since quitting: 0.5  . Smokeless tobacco: Never Used  . Tobacco comment: smokes 1pack every 2-4 days  Substance Use Topics  . Alcohol use: Yes    Alcohol/week: 4.0 standard drinks    Types: 4 Standard drinks or equivalent per week  Comment: 2 glasses of wine per week  . Drug use: No    Home Medications Prior to Admission medications   Medication Sig Start Date End Date Taking? Authorizing Provider  amoxicillin-clavulanate (AUGMENTIN) 875-125 MG tablet Take 1 tablet by mouth every 12 (twelve) hours for 10 days. 12/14/20 12/24/20 Yes Lorelee New, PA-C  amitriptyline (ELAVIL) 50 MG tablet Take by mouth. 10/18/16   [provider]  amLODipine (NORVASC) 5 MG tablet Take 1 tablet (5 mg total) by mouth daily. 06/27/20   [place], Betty G, MD  amoxicillin (AMOXIL) 500 MG capsule Take by mouth. 09/16/20   [provider]  aspirin-acetaminophen-caffeine (EXCEDRIN MIGRAINE) [phone] MG tablet Take 2 tablets by mouth every 6 (six) hours as needed for headache.    [provider]  baclofen (LIORESAL) 10 MG tablet baclofen 10 mg tablet Patient not taking: Reported on 09/24/2020    [provider]  [facility] ASPIRIN 325 MG tablet Take by mouth. 09/05/20   [provider]  EPINEPHrine 0.3 mg/0.3 mL IJ SOAJ injection Inject 0.3 mg into the muscle as needed for anaphylaxis.    [provider]  fenofibrate 54 MG tablet TAKE 1 TABLET BY MOUTH EVERY DAY **NEED APPT** 12/09/20   [place], Betty G, MD  gabapentin (NEURONTIN) 100 MG capsule Start weaning med off as intructed and as tolerated. Patient not taking: Reported on 09/24/2020 06/10/20   [place], Betty G, MD  medroxyPROGESTERone (DEPO-PROVERA) 150 MG/ML injection 1 ml    [provider]  ondansetron (ZOFRAN) 4 MG tablet Take by mouth. 09/17/20   [provider]  oxyCODONE (OXY IR/ROXICODONE) 5 MG immediate release tablet Take by mouth. 09/17/20   [provider]  traMADol (ULTRAM) 50 MG tablet Take 50 mg by mouth every 6 (six) hours as needed. 09/17/20   [provider]  valACYclovir (VALTREX) 1000 MG tablet TAKE 1 TABLET (1,000 MG TOTAL) BY MOUTH 2 (TWO) TIMES DAILY. 02/01/18   Roderick Pee, MD  zonisamide (ZONEGRAN) 25 MG capsule zonisamide 25 mg capsule    [provider]    Allergies    Patient has no known allergies.  Review of Systems   Review of Systems  All other systems reviewed and are negative.   Physical Exam Updated Vital Signs BP (!) 139/95   Pulse (!) 113   Temp 98.1 F (36.7 C)   Resp 18   SpO2 97%   Physical Exam Vitals and nursing note reviewed. Exam conducted with a chaperone present.  Constitutional:      Appearance: Normal appearance.  HENT:     Head: Normocephalic and atraumatic.  Eyes:     General: No scleral icterus.    Conjunctiva/sclera: Conjunctivae normal.  Pulmonary:     Effort: Pulmonary effort is normal.  Musculoskeletal:     Comments: Multiple wounds to first digit right hand.  Gaping tissue defect over first MCP joint with tissue flap that when pulled back exposes underlying muscle and adipose tissue.  Can see extensor tendon, does not appear to be torn.  Second flap laceration distally over IP joint, ulnar  aspect.  No obvious foreign bodies or gross contamination. Functionally she is intact.  Able to touch her thumb to tips of all fingers and demonstrate good flexion and extension with strength intact against resistance.  No bony tenderness.  Capillary refill less than 2 seconds.  Pulse intact.  Sensation intact throughout.  Skin:    General: Skin is dry.  Neurological:  Mental Status: She is alert.     GCS: GCS eye subscore is 4. GCS verbal subscore is 5. GCS motor subscore is 6.  Psychiatric:        Mood and Affect: Mood normal.        Behavior: Behavior normal.        Thought Content: Thought content normal.             ED Results / Procedures / Treatments   Labs (all labs ordered are listed, but only abnormal results are displayed) Labs Reviewed - No data to display  EKG None  Radiology DG Hand Complete Right  Result Date: 12/14/2020 CLINICAL DATA:  Dog bite to the right hand. Laceration adjacent to the first digit. EXAM: RIGHT HAND - COMPLETE 3+ VIEW COMPARISON:  None. FINDINGS: There is no evidence of fracture or dislocation. There is no evidence of arthropathy or other focal bone abnormality. Edema and soft tissue air adjacent to the thumb metacarpal in the thenar eminence. No radiopaque foreign body IMPRESSION: Edema and soft tissue air adjacent to the thumb metacarpal in the thenar eminence. No radiopaque foreign body or osseous abnormality. Electronically Signed   By: Narda RutherfordMelanie  Sanford M.D.   On: 12/14/2020 19:54    Procedures .Marland Kitchen.Laceration Repair  Date/Time: 12/14/2020 9:01 PM Performed by: Lorelee NewGreen, Garrett L, PA-C Authorized by: Lorelee NewGreen, Garrett L, PA-C   Consent:    Consent obtained:  Verbal   Consent given by:  Patient   Risks discussed:  Infection, need for additional repair, pain, poor cosmetic result, poor wound healing, nerve damage, retained foreign body, tendon damage and vascular damage   Alternatives discussed:  No treatment and delayed  treatment Universal protocol:    Procedure explained and questions answered to patient or proxy's satisfaction: yes     Relevant documents present and verified: yes     Test results available: yes     Imaging studies available: yes     Required blood products, implants, devices, and special equipment available: yes     Site/side marked: yes     Immediately prior to procedure, a time out was called: yes     Patient identity confirmed:  Verbally with patient Anesthesia:    Anesthesia method:  Local infiltration   Local anesthetic:  Lidocaine 2% w/o epi Laceration details:    Location:  Hand   Hand location:  R hand, dorsum   Length (cm):  3 Pre-procedure details:    Preparation:  Imaging obtained to evaluate for foreign bodies Exploration:    Hemostasis achieved with:  Direct pressure   Imaging obtained: x-ray     Imaging outcome: foreign body not noted     Wound exploration: wound explored through full range of motion and entire depth of wound visualized   Treatment:    Area cleansed with:  Saline   Debridement:  None Skin repair:    Repair method:  Sutures   Suture size:  5-0   Suture material:  Prolene   Suture technique:  Simple interrupted   Number of sutures:  3 Approximation:    Approximation:  Loose Post-procedure details:    Dressing:  Sterile dressing   Procedure completion:  Tolerated well, no immediate complications .Marland Kitchen.Laceration Repair  Date/Time: 12/14/2020 9:02 PM Performed by: Lorelee NewGreen, Garrett L, PA-C Authorized by: Lorelee NewGreen, Garrett L, PA-C   Consent:    Consent obtained:  Verbal   Consent given by:  Patient   Risks discussed:  Infection, need for additional repair, pain,  poor cosmetic result, poor wound healing, nerve damage, retained foreign body, tendon damage and vascular damage   Alternatives discussed:  No treatment and delayed treatment Universal protocol:    Procedure explained and questions answered to patient or proxy's satisfaction: yes      Relevant documents present and verified: yes     Test results available: yes     Imaging studies available: yes     Required blood products, implants, devices, and special equipment available: yes     Site/side marked: yes     Immediately prior to procedure, a time out was called: yes     Patient identity confirmed:  Verbally with patient Anesthesia:    Anesthesia method:  Local infiltration   Local anesthetic:  Lidocaine 2% w/o epi Laceration details:    Location:  Finger   Finger location:  R thumb   Length (cm):  1 Exploration:    Hemostasis achieved with:  Direct pressure   Imaging obtained: x-ray     Imaging outcome: foreign body not noted     Wound exploration: wound explored through full range of motion   Treatment:    Irrigation solution:  Sterile water Skin repair:    Repair method:  Sutures   Suture size:  5-0   Suture material:  Prolene   Number of sutures:  1 Approximation:    Approximation:  Loose Repair type:    Repair type:  Simple Post-procedure details:    Dressing:  Sterile dressing   Procedure completion:  Tolerated well, no immediate complications     Medications Ordered in ED Medications  lidocaine (XYLOCAINE) 2 % (with pres) injection 100 mg (100 mg Infiltration Given by Other 12/14/20 1920)    ED Course  I have reviewed the triage vital signs and the nursing notes.  Pertinent labs & imaging results that were available during my care of the patient were reviewed by me and considered in my medical decision making (see chart for details).    MDM Rules/Calculators/A&P                          Patient presents to the ED sent from urgent care after dog bite to dominant hand.  There is a large tissue defect to the base of first MCP joint, dorsal aspect.  I decided to repair loosely with 3 sutures.  There is also another gaping wound over ulnar aspect of thumb near the IP joint that also required 1 suture.  I cautioned patient about risk of infection.   She declined splint and first antibiotic here in the ED, but will wrap with a dressing and prescribe her Augmentin x10 days.  She will pick up her prescription this evening and take first dose.  She will follow up with her primary care provider in the next few days for wound check and ongoing evaluation and management.  While I could see the extensor tendon to first phalange on right hand, there was no obvious injury.  She is functionally and neurovascularly intact.  Radial pulse intact.  She is not diabetic.  It was well irrigated here in the ED and had been previously irrigated at [facility].  She is up-to-date on her tetanus immunization.  Her dogs are also up-to-date on her vaccinations.  Patient satisfied with procedure and current cosmetic outcome.  Will refer to hand surgery in the event she develops functional limitations or compromise.    ER return precautions discussed.  Patient voiced understanding and is agreeable to the plan.   Final Clinical Impression(s) / ED Diagnoses Final diagnoses:  Dog bite, initial encounter    Rx / DC Orders ED Discharge Orders         Ordered    amoxicillin-clavulanate (AUGMENTIN) 875-125 MG tablet  Every 12 hours        12/14/20 2055           Elvera Maria 12/14/20 2103    Derwood Kaplan, MD 12/14/20 2124

## 2020-12-14 NOTE — ED Triage Notes (Signed)
Patient reports dog bite to left hand. Reports it was her dog and vaccines up to date. Last tetanus 2019.

## 2021-03-18 ENCOUNTER — Encounter: Payer: Self-pay | Admitting: Family Medicine

## 2021-03-18 ENCOUNTER — Ambulatory Visit (INDEPENDENT_AMBULATORY_CARE_PROVIDER_SITE_OTHER): Payer: BC Managed Care – PPO | Admitting: Family Medicine

## 2021-03-18 ENCOUNTER — Other Ambulatory Visit: Payer: Self-pay

## 2021-03-18 VITALS — BP 128/80 | HR 100 | Temp 98.2°F | Resp 16 | Ht 65.0 in | Wt 124.1 lb

## 2021-03-18 DIAGNOSIS — W57XXXA Bitten or stung by nonvenomous insect and other nonvenomous arthropods, initial encounter: Secondary | ICD-10-CM

## 2021-03-18 DIAGNOSIS — S40861A Insect bite (nonvenomous) of right upper arm, initial encounter: Secondary | ICD-10-CM

## 2021-03-18 MED ORDER — DOXYCYCLINE HYCLATE 100 MG PO TABS: 200.0000 mg | ORAL_TABLET | Freq: Once | ORAL | 0 refills | Status: AC

## 2021-03-18 MED ORDER — TRIAMCINOLONE ACETONIDE 0.1 % EX CREA: 1.0000 "application " | TOPICAL_CREAM | Freq: Two times a day (BID) | CUTANEOUS | 0 refills | Status: DC

## 2021-03-18 NOTE — Progress Notes (Signed)
Chief Complaint  Patient presents with   tick bite    Happened on Sunday, tick did bite but did not get embedded. Area is irritated/inflamed.    HPI: Ms.Lorita Mcglade is a 45 y.o. female with hx of HTN and HLD here today complaining of pruritic skin rash posterior aspect of right arm noted after tick bite. 3 days ago she noted tick attached to skin, not embedded or engorged. She removed tick and thinks it has been on for about an hour. Problem is constant, it seems to be stable. No alleviating factors identified. No associated fever,chills,cough,wheezing,dyspnea,arthralgias,or myalgias.  She has not tried OTC medications.  Review of Systems  Constitutional:  Negative for appetite change and fatigue.  HENT:  Negative for mouth sores, nosebleeds, sore throat, trouble swallowing and voice change.   Cardiovascular:  Negative for leg swelling.  Gastrointestinal:  Negative for abdominal pain, nausea and vomiting.       No changes in bowel habits.  Skin:  Negative for pallor and wound.  Neurological:  Negative for weakness, numbness and headaches.  Hematological:  Negative for adenopathy. Does not bruise/bleed easily.  Rest see pertinent positives and negatives per HPI.  Current Outpatient Medications on File Prior to Visit  Medication Sig Dispense Refill   amitriptyline (ELAVIL) 50 MG tablet Take by mouth.     amLODipine (NORVASC) 5 MG tablet Take 1 tablet (5 mg total) by mouth daily. 90 tablet 3   amoxicillin (AMOXIL) 500 MG capsule Take by mouth.     aspirin-acetaminophen-caffeine (EXCEDRIN MIGRAINE) 250-250-65 MG tablet Take 2 tablets by mouth every 6 (six) hours as needed for headache.     [facility] ASPIRIN 325 MG tablet Take by mouth.     EPINEPHrine 0.3 mg/0.3 mL IJ SOAJ injection Inject 0.3 mg into the muscle as needed for anaphylaxis.     fenofibrate 54 MG tablet TAKE 1 TABLET BY MOUTH EVERY DAY **NEED APPT** 90 tablet 0   medroxyPROGESTERone (DEPO-PROVERA) 150 MG/ML injection 1 ml      ondansetron (ZOFRAN) 4 MG tablet Take by mouth.     oxyCODONE (OXY IR/ROXICODONE) 5 MG immediate release tablet Take by mouth.     traMADol (ULTRAM) 50 MG tablet Take 50 mg by mouth every 6 (six) hours as needed.     valACYclovir (VALTREX) 1000 MG tablet TAKE 1 TABLET (1,000 MG TOTAL) BY MOUTH 2 (TWO) TIMES DAILY. 90 tablet 1   zonisamide (ZONEGRAN) 25 MG capsule zonisamide 25 mg capsule     No current facility-administered medications on file prior to visit.   Past Medical History:  Diagnosis Date   Hyperlipidemia    Hypertension    No Known Allergies  Social History   Socioeconomic History   Marital status: Single    Spouse name: Not on file   Number of children: Not on file   Years of education: Not on file   Highest education level: Not on file  Occupational History   Not on file  Tobacco Use   Smoking status: Former    Pack years: 0.00    Types: Cigarettes    Quit date: 06/11/2020    Years since quitting: 0.7   Smokeless tobacco: Never   Tobacco comments:    smokes 1pack every 2-4 days  Substance and Sexual Activity   Alcohol use: Yes    Alcohol/week: 4.0 standard drinks    Types: 4 Standard drinks or equivalent per week    Comment: 2 glasses of wine per week  Drug use: No   Sexual activity: Yes    Partners: Male    Birth control/protection: Injection    Comment: Depo Provera  Other Topics Concern   Not on file  Social History Narrative   Not on file   Social Determinants of Health   Financial Resource Strain: Not on file  Food Insecurity: Not on file  Transportation Needs: Not on file  Physical Activity: Not on file  Stress: Not on file  Social Connections: Not on file   Vitals:   03/18/21 0837  BP: 128/80  Pulse: 100  Resp: 16  Temp: 98.2 F (36.8 C)  SpO2: 97%   Body mass index is 20.66 kg/m.  Physical Exam Vitals and nursing note reviewed.  Constitutional:      General: She is not in acute distress.    Appearance: She is  well-developed and normal weight.  HENT:     Head: Normocephalic and atraumatic.  Eyes:     Conjunctiva/sclera: Conjunctivae normal.  Cardiovascular:     Rate and Rhythm: Normal rate and regular rhythm.  Pulmonary:     Effort: Pulmonary effort is normal. No respiratory distress.     Breath sounds: Normal breath sounds.  Lymphadenopathy:     Cervical: No cervical adenopathy.  Skin:    General: Skin is warm.     Findings: Rash present.       Neurological:     General: No focal deficit present.     Mental Status: She is alert and oriented to person, place, and time.     Gait: Gait normal.  Psychiatric:        Speech: Speech normal.     Comments: Well groomed, good eye contact.   ASSESSMENT AND PLAN:  Ms.Danyelle was seen today for tick bite.  Diagnoses and all orders for this visit:  Tick bite of right upper arm, initial encounter I do not think labs are necessary today. Doxycycline prophylactic dose recommended.  We discussed signs and symptoms of lyme disease. Instructed about warning signs.  -     doxycycline (VIBRA-TABS) 100 MG tablet; Take 2 tablets (200 mg total) by mouth once for 1 dose.  Insect bite of right upper arm with local reaction, initial encounter Local tick bite reaction. Topical steroid recommended, small amount bid for 14 days at the time. Explained that it may take a few weeks for lesion to completely resolved. Monitor for changes in size.  -     triamcinolone cream (KENALOG) 0.1 %; Apply 1 application topically 2 (two) times daily.  Return if symptoms worsen or fail to improve.   Meher Kucinski G. [place], MD  [facility] [place] office.

## 2021-03-18 NOTE — Patient Instructions (Addendum)
A few things to remember from today's visit:  Tick bite of right upper arm, initial encounter - Plan: doxycycline (VIBRA-TABS) 100 MG tablet  If you need refills please call your pharmacy. Do not use My Chart to request refills or for acute issues that need immediate attention. Doxycycline 2 tabs once. Monitor for worsening rash. Topical triamcinolone will help with itching.   Please be sure medication list is accurate.  Tick Bite Information, Adult  Ticks are insects that can bite. Most ticks live in shrubs and grassy areas. They climb onto people and animals that go by. Then they bite. Some ticks carrygerms that can make you sick. How can I prevent tick bites? Take these steps: Use insect repellent Use an insect repellent that has 20% or higher of the ingredients DEET, picaridin, or IR3535. Follow the instructions on the label. Put it on: Bare skin. The tops of your boots. Your pant legs. The ends of your sleeves. If you use an insect repellent that has the ingredient permethrin, follow the instructions on the label. Put it on: Clothing. Boots. Supplies or outdoor gear. Tents. When you are outside Wear long sleeves and long pants. Wear light-colored clothes. Tuck your pant legs into your socks. Stay in the middle of the trail. Do not touch the bushes. Avoid walking through long grass. Check for ticks on your clothes, hair, and skin often while you are outside. Before going inside your house, check your clothes, skin, head, neck, armpits, waist, groin, and joint areas. When you go indoors Check your clothes for ticks. Dry your clothes in a dryer on high heat for 10 minutes or more. If clothes are damp, additional time may be needed. Wash your clothes right away if they need to be washed. Use hot water. Check your pets and outdoor gear. Shower right away. Check your body for ticks. Do a full body check using a mirror. What is the right way to remove a tick? Remove the tick  from your skin as soon as possible. Do not remove the tick with your bare fingers. To remove a tick that is crawling on your skin: Go outdoors and brush the tick off. Use tape or a lint roller. To remove a tick that is biting: Wash your hands. If you have latex gloves, put them on. Use tweezers, curved forceps, or a tick-removal tool to grasp the tick. Grasp the tick as close to your skin and as close to the tick's head as possible. Gently pull up until the tick lets go. Try to keep the tick's head attached to its body. Do not twist or jerk the tick. Do not squeeze or crush the tick. Do not try to remove a tick with heat, alcohol, petroleum jelly, or fingernail polish. What should I do after taking out a tick? Throw away the tick. Do not crush a tick with your fingers. Clean the bite area and your hands with soap and water, rubbing alcohol, or an iodine wash. If an antiseptic cream or ointment is available, apply a small amount to the bite area. Wash and disinfect any instruments that you used to remove the tick. How should I get rid of a live tick? To dispose of a live tick, use one of these methods: Place the tick in rubbing alcohol. Place the tick in a bag or container you can close tightly. Wrap the tick tightly in tape. Flush the tick down the toilet. Contact a doctor if: You have symptoms, such as: A fever  or chills. A red rash that makes a circle (bull's-eye rash) in the bite area. Redness and swelling where the tick bit you. Headache. Pain in a muscle, joint, or bone. Being more tired than normal. Trouble walking or moving your legs. Numbness in your legs. Tender and swollen lymph glands. A part of a tick breaks off and gets stuck in your skin. Get help right away if: You cannot remove a tick. You cannot move (have paralysis) or feel weak. You are feeling worse or have new symptoms. You find a tick that is biting you and filled with blood. This is important if you are  in an area where diseases from ticks are common. Summary Ticks may carry germs that can make you sick. To prevent tick bites wear long sleeves, long pants, and light colors. Use insect repellent. Follow the instructions on the label. If the tick is biting, do not try to remove it with heat, alcohol, petroleum jelly, or fingernail polish. Use tweezers, curved forceps, or a tick-removal tool to grasp the tick. Gently pull up until the tick lets go. Do not twist or jerk the tick. Do not squeeze or crush the tick. If you have symptoms, contact a doctor. This information is not intended to replace advice given to you by your health care provider. Make sure you discuss any questions you have with your healthcare provider. Document Revised: 09/03/2019 Document Reviewed: 09/03/2019 Elsevier Patient Education  2022 [employer].

## 2021-07-16 ENCOUNTER — Other Ambulatory Visit: Payer: Self-pay | Admitting: Family Medicine

## 2021-07-16 DIAGNOSIS — S40861A Insect bite (nonvenomous) of right upper arm, initial encounter: Secondary | ICD-10-CM

## 2021-08-01 ENCOUNTER — Other Ambulatory Visit: Payer: Self-pay | Admitting: Family Medicine

## 2021-08-01 DIAGNOSIS — S40861A Insect bite (nonvenomous) of right upper arm, initial encounter: Secondary | ICD-10-CM

## 2021-08-01 DIAGNOSIS — W57XXXA Bitten or stung by nonvenomous insect and other nonvenomous arthropods, initial encounter: Secondary | ICD-10-CM

## 2021-09-17 IMAGING — CT CT BIOPSY
1 of 3 series · 14 of 32 positions shown, 19 images · non-contrast
Comparison: none

CLINICAL DATA: Right pelvic and leg pain.

[Series 2: needle -guided injection · axial · 0.64mm/px · z∈[-111,-53]mm · 14 of 33 slices shown, 19 images]
[im 2/33  soft-tissue]
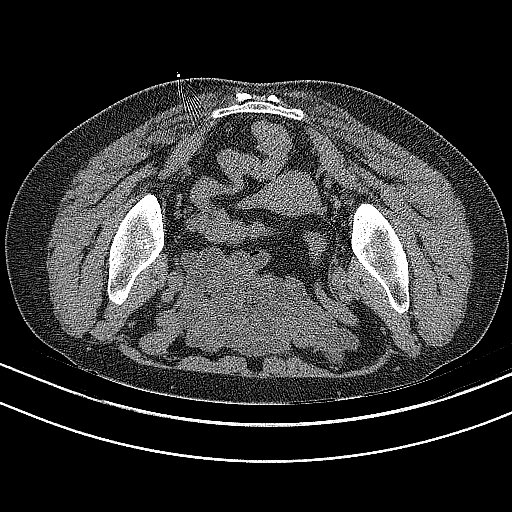
[im 2/33  bone]
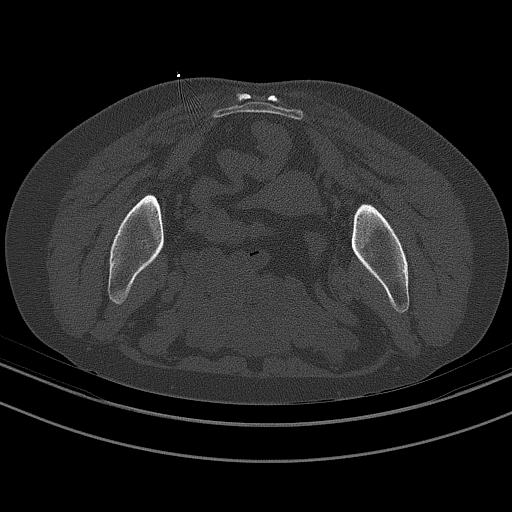
[im 4/33  soft-tissue]
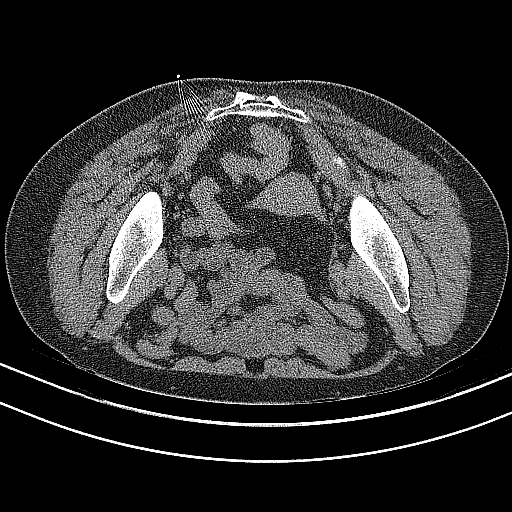
[im 8/33  soft-tissue]
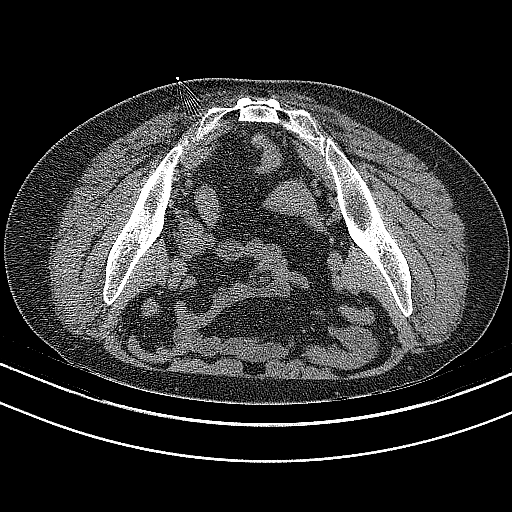
[im 10/33  soft-tissue]
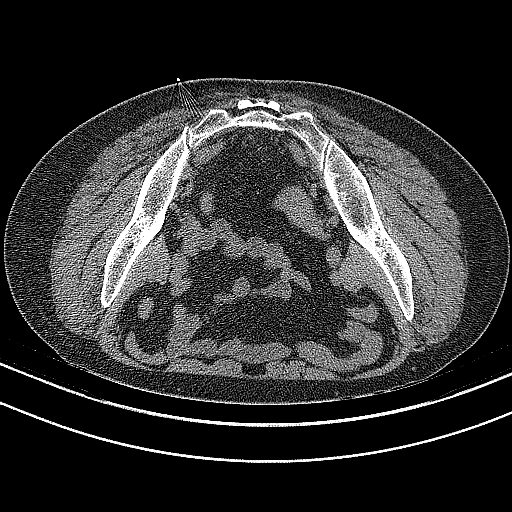
[im 12/33  soft-tissue]
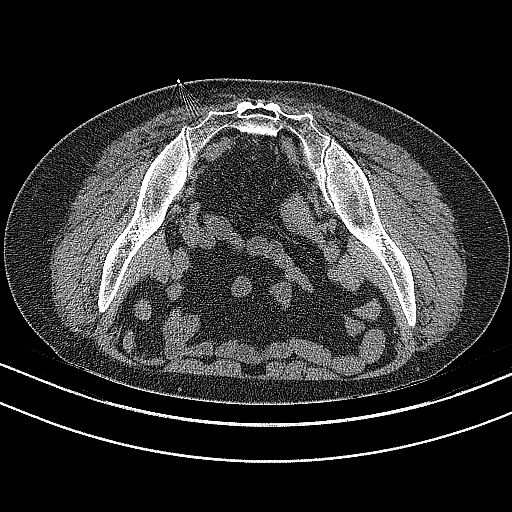
[im 14/33  soft-tissue]
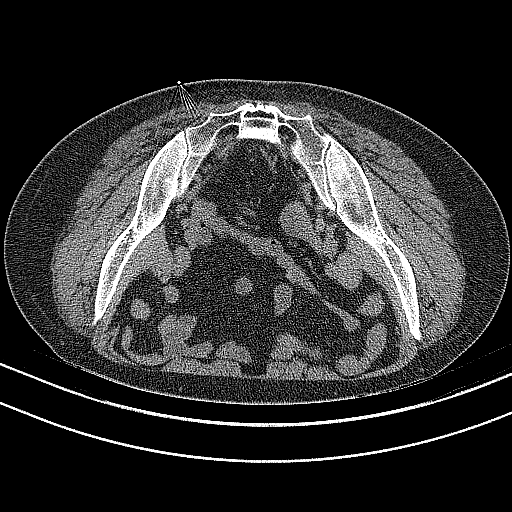
[im 17/33  soft-tissue]
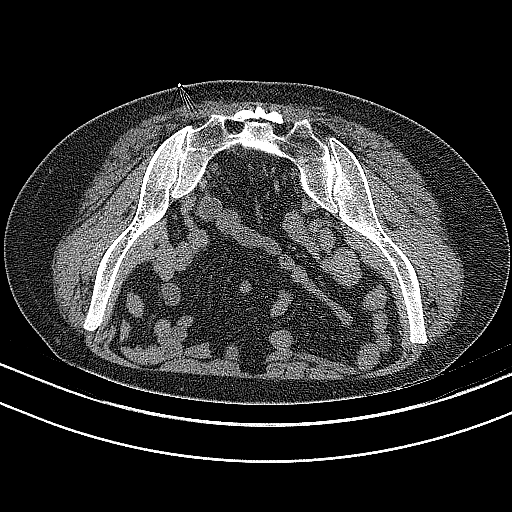
[im 19/33  soft-tissue]
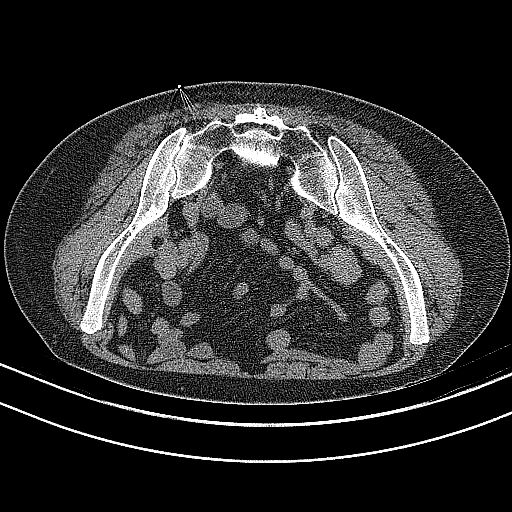
[im 21/33  soft-tissue]
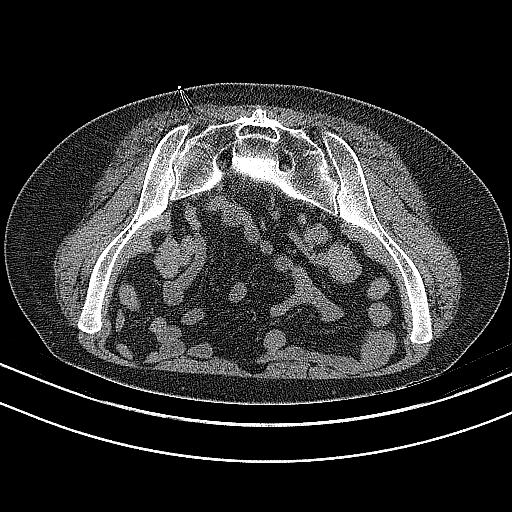
[im 21/33  bone]
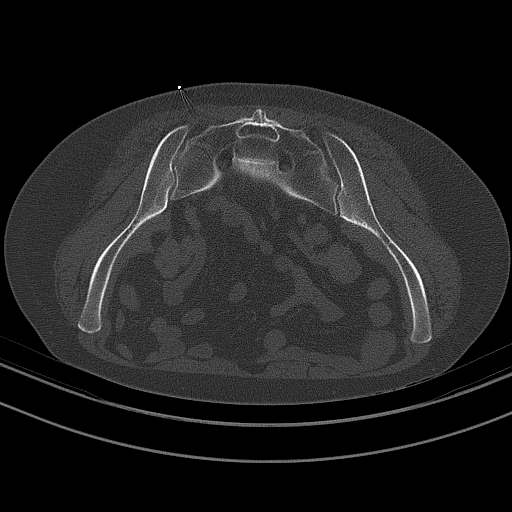
[im 23/33  soft-tissue]
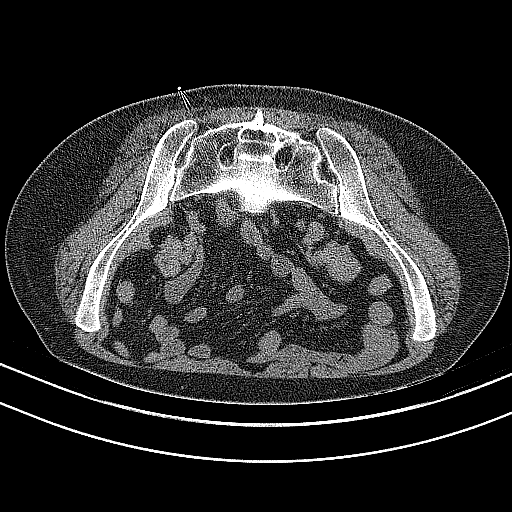
[im 25/33  soft-tissue]
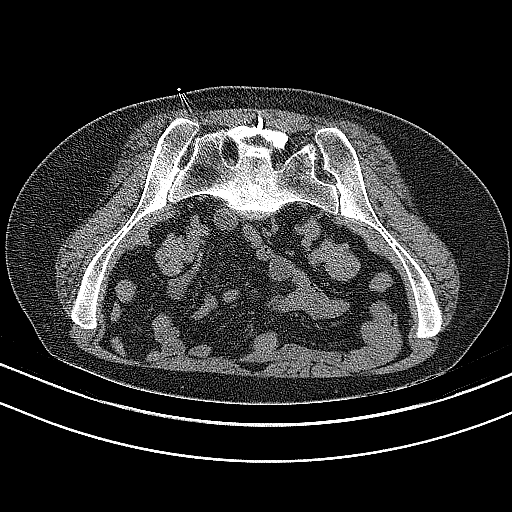
[im 25/33  lung]
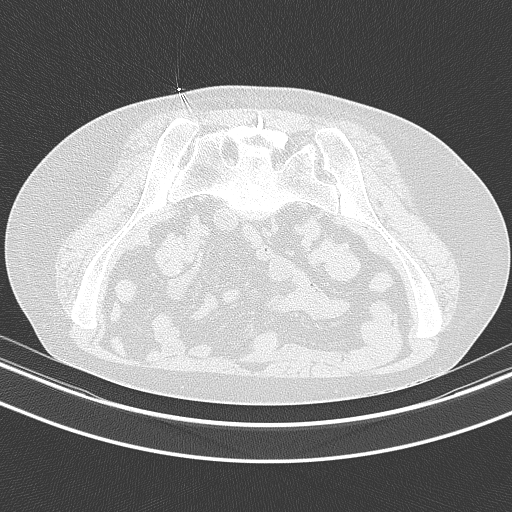
[im 27/33  lung]
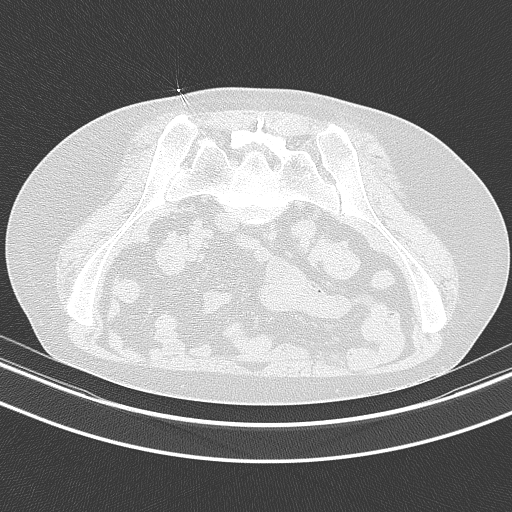
[im 29/33  soft-tissue]
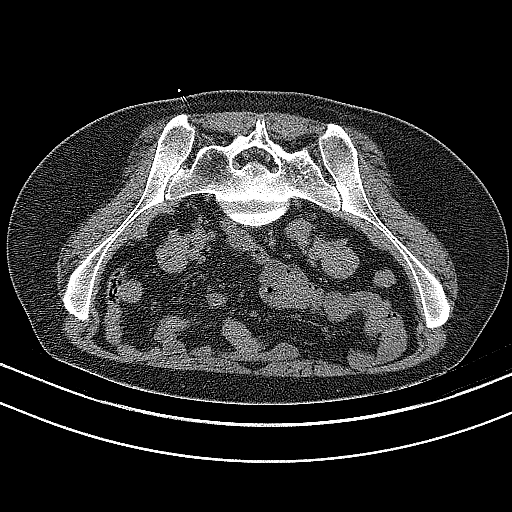
[im 29/33  lung]
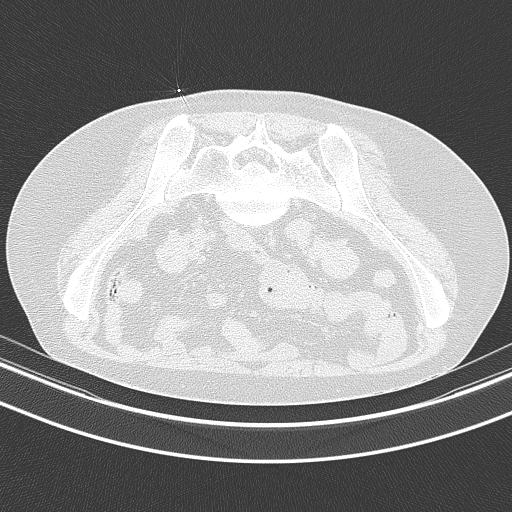
[im 31/33  soft-tissue]
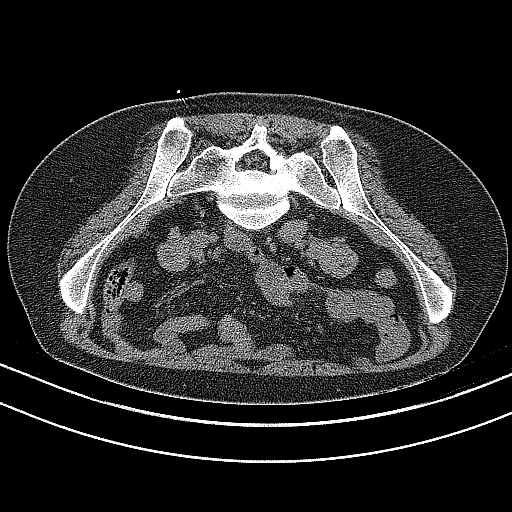
[im 31/33  lung]
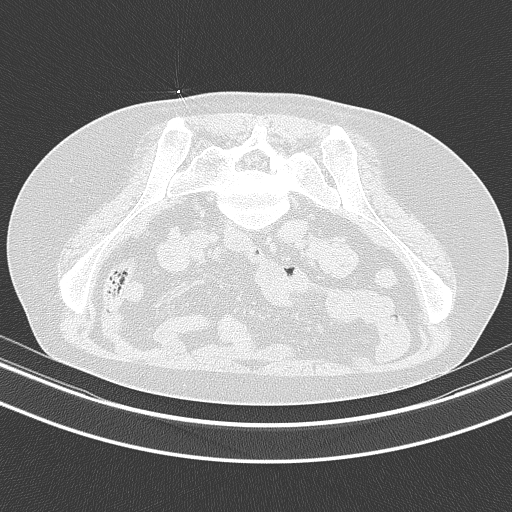

[14 of 32 positions shown; findings below may reference images not displayed]

EXAM:
Right CT GUIDED SI JOINT INJECTION



After local anesthesia with 1% lidocaine without epinephrine and
subsequent deep anesthesia, a 22 gauge spinal needle was advanced
into the right SI joint under intermittent CT guidance.

Once the needle was in satisfactory position, representative image
was captured with the needle demonstrated in the sacroiliac joint.
Subsequently, 2 cc of 0.5% bupivacaine was injected into the right
SI joint. Needles removed and a sterile dressing applied.

No complications were observed.
IMPRESSION: Successful CT-guided right SI joint injection.

During the injection, the patient felt a pressure sensation that
seemed familiar. She did not feel much frank pain. She was
instructed to keep a pain log over the next 6 hours for discussion
with Ladoh Ninah.

## 2021-09-28 NOTE — Progress Notes (Deleted)
ACUTE VISIT No chief complaint on file.  HPI: Kathleen Rubio is a 46 y.o. female, who is here today complaining of *** HPI  Review of Systems Rest see pertinent positives and negatives per HPI.  Current Outpatient Medications on File Prior to Visit  Medication Sig Dispense Refill   amitriptyline (ELAVIL) 50 MG tablet Take by mouth.     amLODipine (NORVASC) 5 MG tablet Take 1 tablet (5 mg total) by mouth daily. 90 tablet 3   amoxicillin (AMOXIL) 500 MG capsule Take by mouth.     aspirin-acetaminophen-caffeine (EXCEDRIN MIGRAINE) 250-250-65 MG tablet Take 2 tablets by mouth every 6 (six) hours as needed for headache.     [employer] ASPIRIN 325 MG tablet Take by mouth.     EPINEPHrine 0.3 mg/0.3 mL IJ SOAJ injection Inject 0.3 mg into the muscle as needed for anaphylaxis.     fenofibrate 54 MG tablet TAKE 1 TABLET BY MOUTH EVERY DAY **NEED APPT** 90 tablet 0   medroxyPROGESTERone (DEPO-PROVERA) 150 MG/ML injection 1 ml     ondansetron (ZOFRAN) 4 MG tablet Take by mouth.     oxyCODONE (OXY IR/ROXICODONE) 5 MG immediate release tablet Take by mouth.     traMADol (ULTRAM) 50 MG tablet Take 50 mg by mouth every 6 (six) hours as needed.     triamcinolone cream (KENALOG) 0.1 % Apply 1 application topically 2 (two) times daily. 30 g 0   valACYclovir (VALTREX) 1000 MG tablet TAKE 1 TABLET (1,000 MG TOTAL) BY MOUTH 2 (TWO) TIMES DAILY. 90 tablet 1   zonisamide (ZONEGRAN) 25 MG capsule zonisamide 25 mg capsule     No current facility-administered medications on file prior to visit.     Past Medical History:  Diagnosis Date   Hyperlipidemia    Hypertension    No Known Allergies  Social History   Socioeconomic History   Marital status: Single    Spouse name: Not on file   Number of children: Not on file   Years of education: Not on file   Highest education level: Not on file  Occupational History   Not on file  Tobacco Use   Smoking status: Former    Types: Cigarettes    Quit date:  06/11/2020    Years since quitting: 1.2   Smokeless tobacco: Never   Tobacco comments:    smokes 1pack every 2-4 days  Substance and Sexual Activity   Alcohol use: Yes    Alcohol/week: 4.0 standard drinks    Types: 4 Standard drinks or equivalent per week    Comment: 2 glasses of wine per week   Drug use: No   Sexual activity: Yes    Partners: Male    Birth control/protection: Injection    Comment: Depo Provera  Other Topics Concern   Not on file  Social History Narrative   Not on file   Social Determinants of Health   Financial Resource Strain: Not on file  Food Insecurity: Not on file  Transportation Needs: Not on file  Physical Activity: Not on file  Stress: Not on file  Social Connections: Not on file    There were no vitals filed for this visit. There is no height or weight on file to calculate BMI.  Physical Exam  ASSESSMENT AND PLAN:  There are no diagnoses linked to this encounter.   No follow-ups on file.   Betty G. Martinique, MD  [facility]. [place] office.  Discharge Instructions   None

## 2021-09-29 ENCOUNTER — Ambulatory Visit: Payer: BC Managed Care – PPO | Admitting: Family Medicine

## 2021-11-20 IMAGING — DX DG CHEST 2V
2 series · 2 of 2 positions shown · non-contrast
Comparison: PA and lateral chest 07/12/2017.

CLINICAL DATA: Preoperative examination. Patient for right hip
replacement.

EXAM:
CHEST - 2 VIEW

[chest pa]
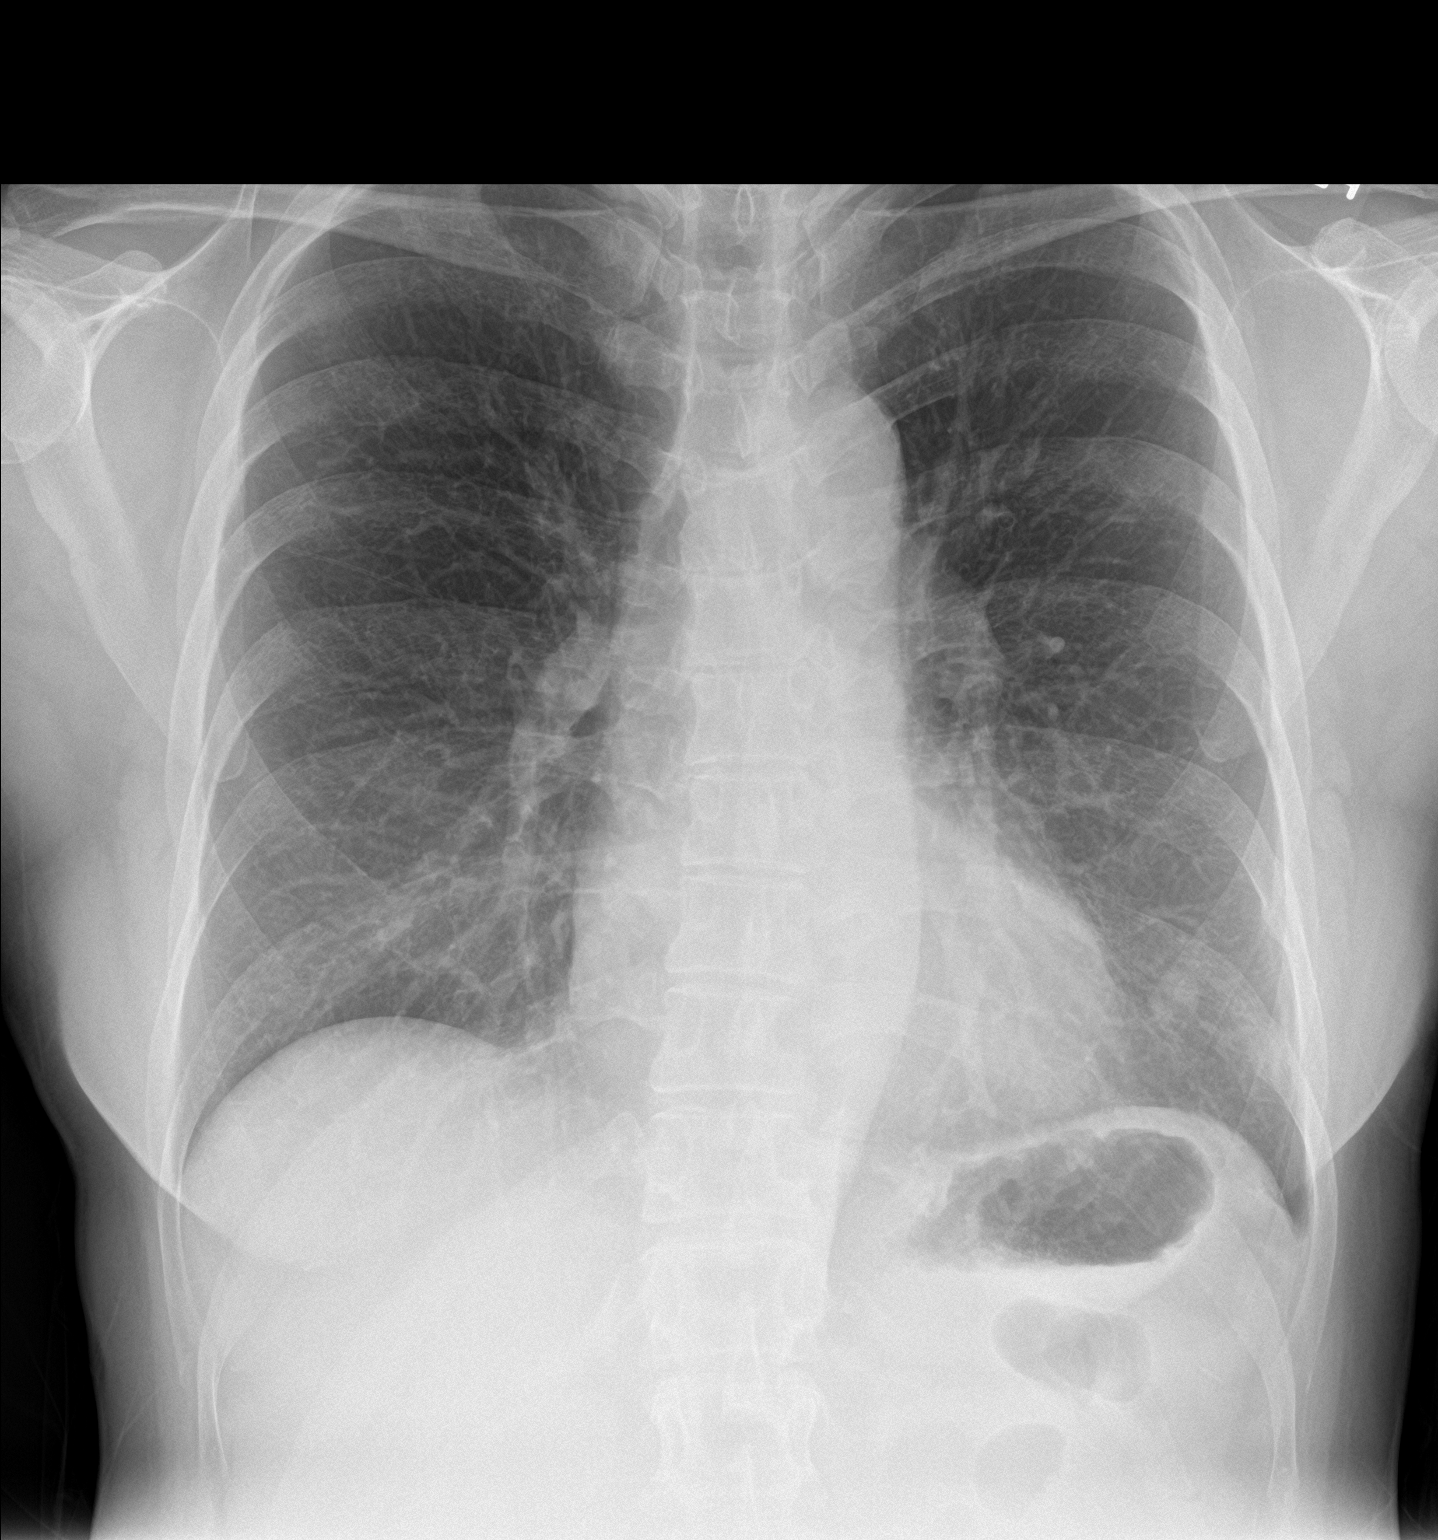

[chest lat]
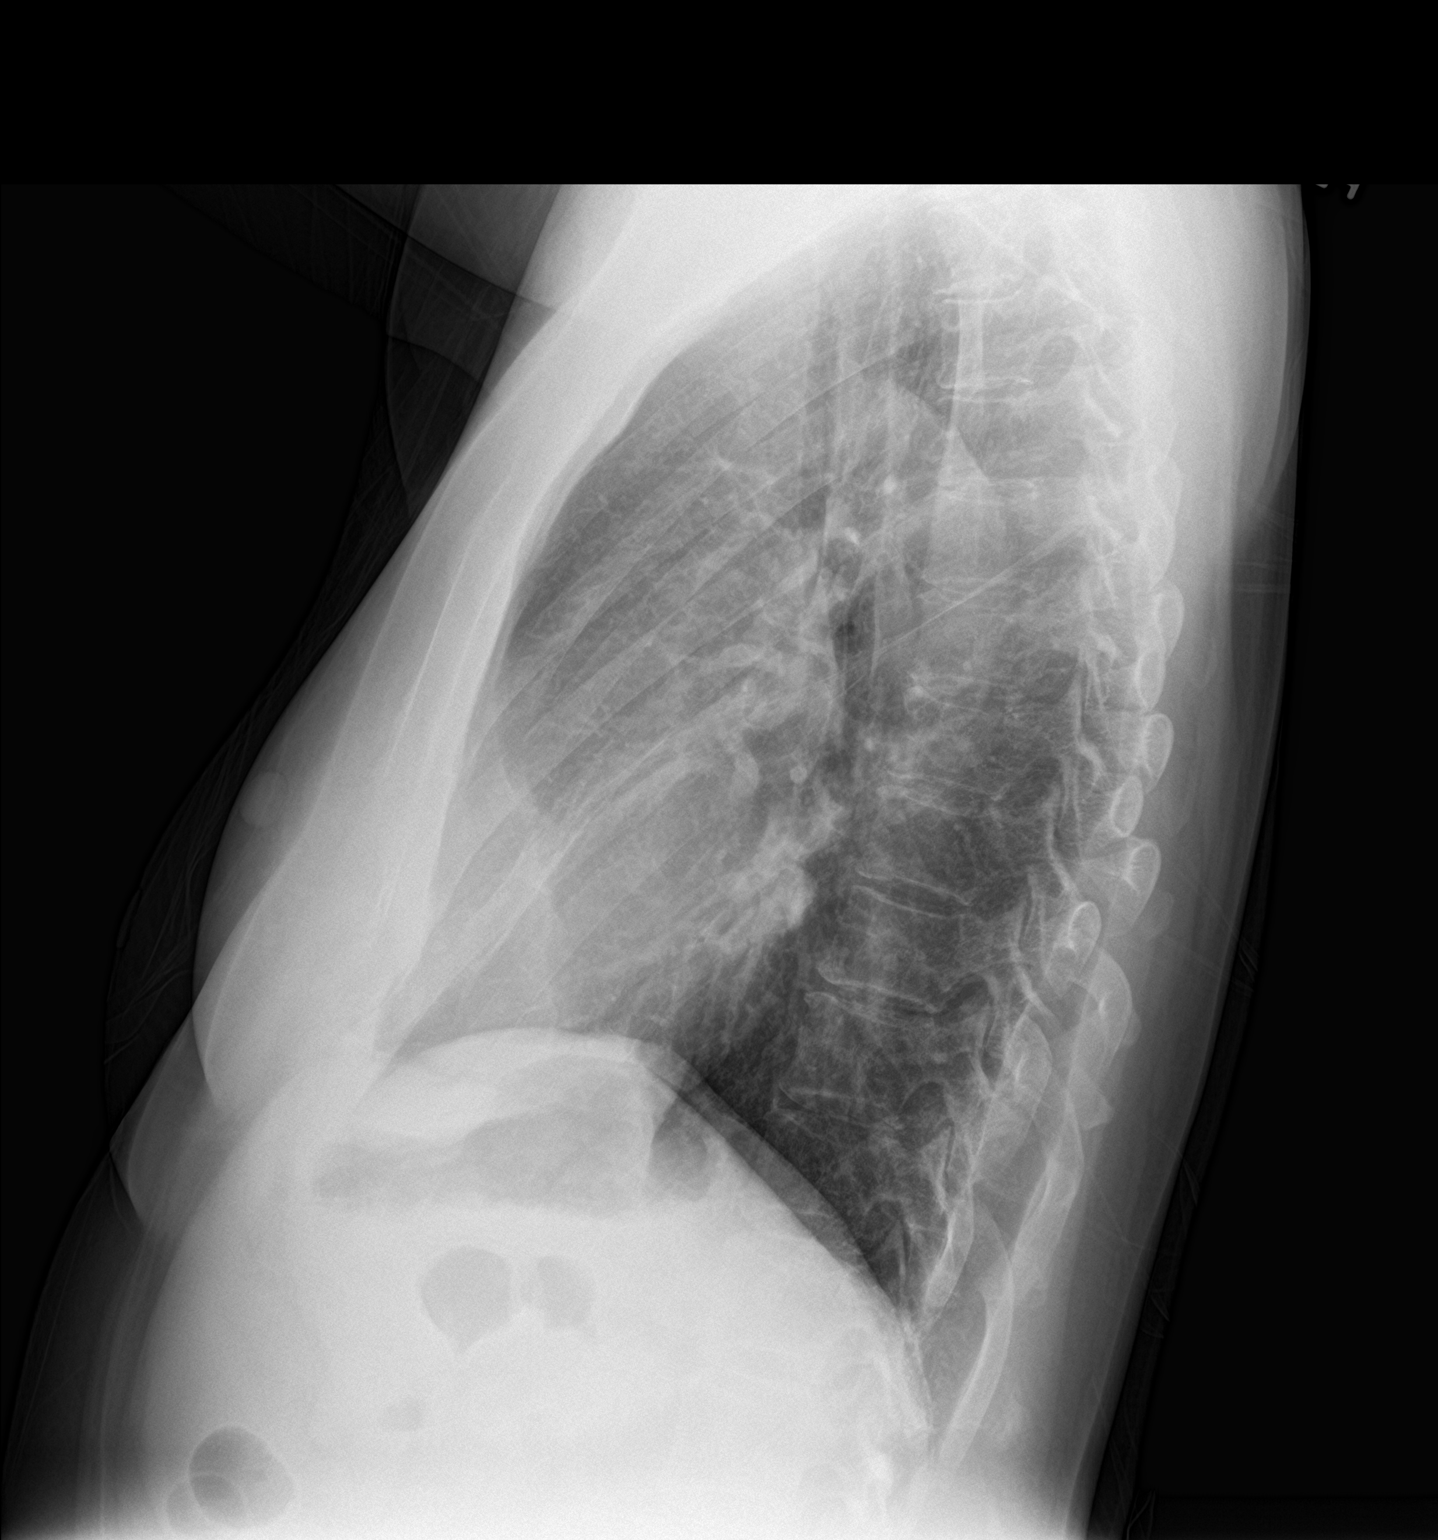

[2 of 2 positions shown; findings below may reference images not displayed]

FINDINGS: Lungs clear. Nipple shadow on the left noted. Heart size normal. No
pneumothorax or pleural fluid. No acute or focal bony abnormality.
IMPRESSION: No acute disease.

## 2021-12-03 ENCOUNTER — Telehealth: Payer: Self-pay | Admitting: Family Medicine

## 2021-12-03 NOTE — Telephone Encounter (Signed)
Patient called in requesting a TOC from [place] to [facility]. ? ?Please advise. ?

## 2021-12-04 NOTE — Telephone Encounter (Signed)
Fine with me

## 2021-12-08 NOTE — Telephone Encounter (Signed)
Lvm for patient to call back to schedule toc from Dr.Jordan to Dr.Hernandez. ?

## 2021-12-08 NOTE — Telephone Encounter (Signed)
Sorry.  Patient will have to establish first.  thanks ?

## 2021-12-08 NOTE — Telephone Encounter (Signed)
Is it ok to sch pt for cpe she is overdue ?

## 2021-12-09 NOTE — Telephone Encounter (Signed)
Lmom for pt to call back. 

## 2021-12-11 NOTE — Telephone Encounter (Signed)
Pt has been sch for 02-16-2022 ?

## 2022-01-27 ENCOUNTER — Other Ambulatory Visit: Payer: Self-pay | Admitting: Internal Medicine

## 2022-01-27 DIAGNOSIS — Z1231 Encounter for screening mammogram for malignant neoplasm of breast: Secondary | ICD-10-CM

## 2022-02-10 ENCOUNTER — Ambulatory Visit: Payer: 59

## 2022-02-16 ENCOUNTER — Encounter: Payer: 59 | Admitting: Internal Medicine

## 2022-02-17 ENCOUNTER — Ambulatory Visit (INDEPENDENT_AMBULATORY_CARE_PROVIDER_SITE_OTHER): Payer: 59 | Admitting: Internal Medicine

## 2022-02-17 ENCOUNTER — Encounter: Payer: Self-pay | Admitting: Internal Medicine

## 2022-02-17 VITALS — BP 130/88 | HR 93 | Temp 98.1°F | Ht 65.0 in | Wt 122.9 lb

## 2022-02-17 DIAGNOSIS — Z1211 Encounter for screening for malignant neoplasm of colon: Secondary | ICD-10-CM | POA: Diagnosis not present

## 2022-02-17 DIAGNOSIS — L509 Urticaria, unspecified: Secondary | ICD-10-CM

## 2022-02-17 DIAGNOSIS — Z23 Encounter for immunization: Secondary | ICD-10-CM

## 2022-02-17 DIAGNOSIS — F17200 Nicotine dependence, unspecified, uncomplicated: Secondary | ICD-10-CM

## 2022-02-17 DIAGNOSIS — G4700 Insomnia, unspecified: Secondary | ICD-10-CM

## 2022-02-17 DIAGNOSIS — E782 Mixed hyperlipidemia: Secondary | ICD-10-CM | POA: Diagnosis not present

## 2022-02-17 DIAGNOSIS — E781 Pure hyperglyceridemia: Secondary | ICD-10-CM

## 2022-02-17 DIAGNOSIS — I10 Essential (primary) hypertension: Secondary | ICD-10-CM

## 2022-02-17 MED ORDER — AMITRIPTYLINE HCL 50 MG PO TABS
50.0000 mg | ORAL_TABLET | Freq: Every evening | ORAL | 1 refills | Status: DC | PRN
Start: 2022-02-17 — End: 2022-09-01

## 2022-02-17 MED ORDER — EPINEPHRINE 0.3 MG/0.3ML IJ SOAJ: 0.3000 mg | INTRAMUSCULAR | 1 refills | Status: DC | PRN

## 2022-02-17 MED ORDER — PREDNISONE 10 MG PO TABS: 10.0000 mg | ORAL_TABLET | Freq: Every day | ORAL | 1 refills | Status: DC

## 2022-02-17 MED ORDER — FENOFIBRATE 54 MG PO TABS: 54.0000 mg | ORAL_TABLET | Freq: Every day | ORAL | 1 refills | Status: DC

## 2022-02-17 NOTE — Progress Notes (Signed)
Established Patient Office Visit     CC/Reason for Visit: Establish care, discuss chronic and acute concerns  HPI: Kathleen Rubio is a 46 y.o. female who is coming in today for the above mentioned reasons. Past Medical History is significant for: Ongoing nicotine use of about a third of a pack a day, she has been a smoker for over 30 years.  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  Her mother had breast cancer she is now overdue for her mammogram but already has it scheduled.  She is now eligible for colon cancer screening and requests GI referral.  She is due for Tdap vaccine.  She also has a history of chronic idiopathic urticaria for which she always carries prednisone and an EpiPen for emergencies.  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   Past Medical/Surgical History: Past Medical History:  Diagnosis Date   Hyperlipidemia    Hypertension     Past Surgical History:  Procedure Laterality Date   WISDOM TOOTH EXTRACTION      Social History:  reports that she quit smoking about 20 months ago. Her smoking use included cigarettes. She has never used smokeless tobacco. She reports current alcohol use of about 4.0 standard drinks per week. She reports that she does not use drugs.  Allergies: No Known Allergies  Family History:  Family History  Problem Relation Age of Onset   Breast cancer Mother 12       recurrence at age 45   Cancer Mother 17       head/neck ca   Diabetes Father        type 2   Prostate cancer Father 76       currently 68   Hyperlipidemia Father    Breast cancer Maternal Grandmother        Dx 47s; deceased 52s     Current Outpatient Medications:    amLODipine (NORVASC) 5 MG tablet, Take 1 tablet (5 mg total) by mouth daily.,  Disp: 90 tablet, Rfl: 3   amoxicillin (AMOXIL) 500 MG capsule, Take by mouth., Disp: , Rfl:    medroxyPROGESTERone (DEPO-PROVERA) 150 MG/ML injection, 1 ml, Disp: , Rfl:    ondansetron (ZOFRAN) 4 MG tablet, Take by mouth., Disp: , Rfl:    traZODone (DESYREL) 50 MG tablet, Take 50 mg by mouth at bedtime. PRN, Disp: , Rfl:    valACYclovir (VALTREX) 1000 MG tablet, TAKE 1 TABLET (1,000 MG TOTAL) BY MOUTH 2 (TWO) TIMES DAILY., Disp: 90 tablet, Rfl: 1   zonisamide (ZONEGRAN) 25 MG capsule, zonisamide 25 mg capsule, Disp: , Rfl:    amitriptyline (ELAVIL) 50 MG tablet, Take 1 tablet (50 mg total) by mouth at bedtime as needed for sleep., Disp: 45 tablet, Rfl: 1   EPINEPHrine 0.3 mg/0.3 mL IJ SOAJ injection, Inject 0.3 mg into the muscle as needed for anaphylaxis., Disp: 1 each, Rfl: 1   fenofibrate 54 MG tablet, Take 1 tablet (54 mg total) by mouth daily., Disp: 90 tablet, Rfl: 1   predniSONE (DELTASONE) 10 MG tablet, Take 1 tablet (10 mg total) by mouth daily with breakfast. PRN, Disp: 30 tablet, Rfl: 1  Review of Systems:  Constitutional: Denies fever, chills, diaphoresis, appetite change and fatigue.  HEENT: Denies photophobia, eye pain, redness,  hearing loss, ear pain, congestion, sore throat, rhinorrhea, sneezing, mouth sores, trouble swallowing, neck pain, neck stiffness and tinnitus.   Respiratory: Denies SOB, DOE, cough, chest tightness,  and wheezing.   Cardiovascular: Denies chest pain, palpitations and leg swelling.  Gastrointestinal: Denies nausea, vomiting, abdominal pain, diarrhea, constipation, blood in stool and abdominal distention.  Genitourinary: Denies dysuria, urgency, frequency, hematuria, flank pain and difficulty urinating.  Endocrine: Denies: hot or cold intolerance, sweats, changes in hair or nails, polyuria, polydipsia. Musculoskeletal: Denies myalgias, back pain, joint swelling, arthralgias and gait problem.  Skin: Denies pallor, rash and wound.  Neurological: Denies  dizziness, seizures, syncope, weakness, light-headedness, numbness and headaches.  Hematological: Denies adenopathy. Easy bruising, personal or family bleeding history  Psychiatric/Behavioral: Denies suicidal ideation, mood changes, confusion, nervousness, sleep disturbance and agitation    Physical Exam: There were no vitals filed for this visit.  There is no height or weight on file to calculate BMI.   Constitutional: NAD, calm, comfortable Eyes: PERRL, lids and conjunctivae normal, wears corrective lenses ENMT: Mucous membranes are moist.  Respiratory: clear to auscultation bilaterally, no wheezing, no crackles. Normal respiratory effort. No accessory muscle use.  Cardiovascular: Regular rate and rhythm, no murmurs / rubs / gallops. No extremity edema.  Neurologic: CN 2-12 grossly intact. Sensation intact, DTR normal. Strength 5/5 in all 4.  Psychiatric: Normal judgment and insight. Alert and oriented x 3. Normal mood.    Impression and Plan:  Screening for colon cancer - Plan: Ambulatory referral to Gastroenterology  Hyperlipemia, mixed  Tobacco use disorder  Urticaria of unknown origin - Plan: EPINEPHrine 0.3 mg/0.3 mL IJ SOAJ injection, predniSONE (DELTASONE) 10 MG tablet  Essential hypertension  Hypertriglyceridemia - Plan: fenofibrate 54 MG tablet  Need for Tdap vaccination - Plan: Tdap vaccine greater than or equal to 7yo IM  Insomnia, unspecified type - Plan: amitriptyline (ELAVIL) 50 MG tablet  -Medications have been refilled, GI referral placed for colonoscopy, she follows with [facility] in regards to her hypertension and hyperlipidemia.  She is overdue for Pap smear and will schedule CPE, we can do at that visit.    Time spent:32 minutes reviewing chart, interviewing and examining patient and formulating plan of care.     Chaya Jan, MD [facility]

## 2022-02-23 ENCOUNTER — Ambulatory Visit: Payer: 59

## 2022-03-02 ENCOUNTER — Encounter: Payer: Self-pay | Admitting: Gastroenterology

## 2022-04-08 ENCOUNTER — Ambulatory Visit
Admission: RE | Admit: 2022-04-08 | Discharge: 2022-04-08 | Disposition: A | Discharge status: Home / Self Care | Payer: 59 | Source: Ambulatory Visit | Attending: Internal Medicine | Admitting: Internal Medicine

## 2022-04-08 DIAGNOSIS — Z1231 Encounter for screening mammogram for malignant neoplasm of breast: Secondary | ICD-10-CM

## 2022-04-09 ENCOUNTER — Ambulatory Visit (AMBULATORY_SURGERY_CENTER): Payer: Self-pay | Admitting: *Deleted

## 2022-04-09 VITALS — Ht 65.5 in | Wt 121.0 lb

## 2022-04-09 DIAGNOSIS — Z1211 Encounter for screening for malignant neoplasm of colon: Secondary | ICD-10-CM

## 2022-04-09 MED ORDER — ONDANSETRON HCL 4 MG PO TABS: 4.0000 mg | ORAL_TABLET | ORAL | 0 refills | Status: DC

## 2022-04-09 MED ORDER — SUTAB 1479-225-188 MG PO TABS: 24.0000 | ORAL_TABLET | ORAL | 0 refills | Status: DC

## 2022-04-09 NOTE — Progress Notes (Signed)
No egg or soy allergy known to patient  No issues known to pt with past sedation with any surgeries or procedures Patient denies ever being told they had issues or difficulty with intubation  No FH of Malignant Hyperthermia Pt is not on diet pills Pt is not on  home 02  Pt is not on blood thinners  Pt denies issues with constipation  No A fib or A flutter Have any cardiac testing pending--PT DENIES  SUTAB COUPON TO PT IN PV TODAY AND TO PHARMACY WITH SCRIPT- PT REQUESTED TABLETS FOR PREP - ZOFRAN TO PHARMACY PER PROTOCOL

## 2022-04-22 ENCOUNTER — Encounter: Payer: 59 | Admitting: Gastroenterology

## 2022-05-07 ENCOUNTER — Encounter: Payer: 59 | Admitting: Gastroenterology

## 2022-05-19 ENCOUNTER — Encounter: Payer: 59 | Admitting: Internal Medicine

## 2022-05-23 ENCOUNTER — Other Ambulatory Visit: Payer: Self-pay | Admitting: Internal Medicine

## 2022-05-23 DIAGNOSIS — L509 Urticaria, unspecified: Secondary | ICD-10-CM

## 2022-08-04 ENCOUNTER — Telehealth: Payer: Self-pay | Admitting: Internal Medicine

## 2022-08-04 ENCOUNTER — Encounter: Payer: 59 | Admitting: Internal Medicine

## 2022-08-04 DIAGNOSIS — I10 Essential (primary) hypertension: Secondary | ICD-10-CM

## 2022-08-04 DIAGNOSIS — E781 Pure hyperglyceridemia: Secondary | ICD-10-CM

## 2022-08-04 DIAGNOSIS — Z0000 Encounter for general adult medical examination without abnormal findings: Secondary | ICD-10-CM

## 2022-08-04 NOTE — Addendum Note (Signed)
Addended by: Kern Reap B on: 08/04/2022 04:34 PM   Modules accepted: Orders

## 2022-08-04 NOTE — Telephone Encounter (Signed)
Pt called to say she is not feeling well and will not be able to make her CPE appt today.  Pt was rescheduled for 08/11/22 at 2 pm.  Pt would like to know if she can come in early that morning to do her labs?  Please advise.

## 2022-08-04 NOTE — Telephone Encounter (Signed)
Lab orders placed.  

## 2022-08-11 ENCOUNTER — Encounter: Payer: 59 | Admitting: Internal Medicine

## 2022-08-11 ENCOUNTER — Other Ambulatory Visit: Payer: 59

## 2022-09-01 ENCOUNTER — Ambulatory Visit (INDEPENDENT_AMBULATORY_CARE_PROVIDER_SITE_OTHER): Payer: 59 | Admitting: Internal Medicine

## 2022-09-01 ENCOUNTER — Other Ambulatory Visit: Payer: 59

## 2022-09-01 ENCOUNTER — Encounter: Payer: Self-pay | Admitting: Internal Medicine

## 2022-09-01 VITALS — BP 124/84 | HR 97 | Temp 98.5°F | Ht 64.5 in | Wt 118.5 lb

## 2022-09-01 DIAGNOSIS — F17200 Nicotine dependence, unspecified, uncomplicated: Secondary | ICD-10-CM

## 2022-09-01 DIAGNOSIS — Z0000 Encounter for general adult medical examination without abnormal findings: Secondary | ICD-10-CM

## 2022-09-01 DIAGNOSIS — G4700 Insomnia, unspecified: Secondary | ICD-10-CM

## 2022-09-01 DIAGNOSIS — E782 Mixed hyperlipidemia: Secondary | ICD-10-CM | POA: Diagnosis not present

## 2022-09-01 DIAGNOSIS — Z124 Encounter for screening for malignant neoplasm of cervix: Secondary | ICD-10-CM

## 2022-09-01 DIAGNOSIS — I10 Essential (primary) hypertension: Secondary | ICD-10-CM

## 2022-09-01 MED ORDER — TRAZODONE HCL 50 MG PO TABS: 50.0000 mg | ORAL_TABLET | Freq: Every day | ORAL | 1 refills | Status: DC

## 2022-09-01 MED ORDER — ZOLPIDEM TARTRATE 5 MG PO TABS: 5.0000 mg | ORAL_TABLET | Freq: Every evening | ORAL | 1 refills | Status: DC | PRN

## 2022-09-01 NOTE — Progress Notes (Signed)
Established Patient Office Visit     CC/Reason for Visit: Annual preventive exam  HPI: Kathleen Rubio is a 46 y.o. female who is coming in today for the above mentioned reasons. Past Medical History is significant for: Hypertension, hyperlipidemia, tobacco use.  She is doing well today she is requesting refills of her insomnia medications.  Immunizations are up-to-date.  She has routine eye and dental care.  She exercises by walking daily.  Mammogram is up-to-date, she is overdue for initial screening colonoscopy.  She is overdue for cervical cancer screening.   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 She has been smoking an average of .25 packs per day. She has never used smokeless tobacco. She reports current alcohol use of about 4.0 standard drinks of alcohol per week. She reports that she does not use drugs.  Allergies: No Known Allergies  Family History:  Family History  Problem Relation Age of Onset   Breast cancer Mother 57       recurrence at age 30   Cancer Mother 7       head/neck ca STAGE 4- ALSO SOFT PALATE IN MOUTH-? PRIMARY SOURCE   Diabetes Father        type 2   Prostate cancer Father 35       currently 15   Hyperlipidemia Father    Breast cancer Maternal Grandmother        Dx 47s; deceased 78s   Colon cancer Neg Hx    Colon polyps Neg Hx    Esophageal cancer Neg Hx    Rectal cancer Neg Hx    Stomach cancer Neg Hx      Current Outpatient Medications:    amLODipine (NORVASC) 5 MG tablet, Take 1 tablet (5 mg total) by mouth daily.,  Disp: 90 tablet, Rfl: 3   amoxicillin (AMOXIL) 500 MG capsule, Take by mouth., Disp: , Rfl:    baclofen (LIORESAL) 10 MG tablet, AS NEEDED, Disp: , Rfl:    EPINEPHrine 0.3 mg/0.3 mL IJ SOAJ injection, Inject 0.3 mg into the muscle as needed for anaphylaxis., Disp: 1 each, Rfl: 1   fenofibrate 54 MG tablet, Take 1 tablet (54 mg total) by mouth daily., Disp: 90 tablet, Rfl: 1   ondansetron (ZOFRAN) 4 MG tablet, Take 1 tablet (4 mg total) by mouth as directed., Disp: 2 tablet, Rfl: 0   predniSONE (DELTASONE) 10 MG tablet, Take 1 tablet (10 mg total) by mouth daily with breakfast. PRN, Disp: 30 tablet, Rfl: 1   Sodium Sulfate-Mag Sulfate-KCl (SUTAB) [phone] MG TABS, Take 24 tablets by mouth as directed. SUTAB COUPON [id number] PCN:CN [id number] MEMBER [id number]; no prior authorization, Disp: 24 tablet, Rfl: 0   valACYclovir (VALTREX) 1000 MG tablet, TAKE 1 TABLET (1,000 MG TOTAL) BY MOUTH 2 (TWO) TIMES DAILY., Disp: 90 tablet, Rfl: 1   zolpidem (AMBIEN) 5 MG tablet, Take 1 tablet (5 mg total) by mouth at bedtime as needed for  sleep., Disp: 15 tablet, Rfl: 1   zonisamide (ZONEGRAN) 25 MG capsule, , Disp: , Rfl:    traZODone (DESYREL) 50 MG tablet, Take 1 tablet (50 mg total) by mouth at bedtime. PRN, Disp: 90 tablet, Rfl: 1  Review of Systems:  Constitutional: Denies fever, chills, diaphoresis, appetite change and fatigue.  HEENT: Denies photophobia, eye pain, redness, hearing loss, ear pain, congestion, sore throat, rhinorrhea, sneezing, mouth sores, trouble swallowing, neck pain, neck stiffness and tinnitus.   Respiratory: Denies SOB, DOE, cough, chest tightness,  and wheezing.   Cardiovascular: Denies chest pain, palpitations and leg swelling.  Gastrointestinal: Denies nausea, vomiting, abdominal pain, diarrhea, constipation, blood in stool and abdominal distention.  Genitourinary: Denies dysuria, urgency, frequency, hematuria, flank pain and difficulty urinating.  Endocrine: Denies:  hot or cold intolerance, sweats, changes in hair or nails, polyuria, polydipsia. Musculoskeletal: Denies myalgias, back pain, joint swelling, arthralgias and gait problem.  Skin: Denies pallor, rash and wound.  Neurological: Denies dizziness, seizures, syncope, weakness, light-headedness, numbness and headaches.  Hematological: Denies adenopathy. Easy bruising, personal or family bleeding history  Psychiatric/Behavioral: Denies suicidal ideation, mood changes, confusion, nervousness, sleep disturbance and agitation    Physical Exam: Vitals:   09/01/22 1407  BP: 124/84  Pulse: 97  Temp: 98.5 F (36.9 C)  TempSrc: Oral  SpO2: 97%  Weight: 118 lb 8 oz (53.8 kg)  Height: 5' 4.5" (1.638 m)    Body mass index is 20.03 kg/m.   Constitutional: NAD, calm, comfortable Eyes: PERRL, lids and conjunctivae normal, wears corrective lenses ENMT: Mucous membranes are moist. Posterior pharynx clear of any exudate or lesions. Normal dentition. Tympanic membrane is pearly white, no erythema or bulging. Neck: normal, supple, no masses, no thyromegaly Respiratory: clear to auscultation bilaterally, no wheezing, no crackles. Normal respiratory effort. No accessory muscle use.  Cardiovascular: Regular rate and rhythm, no murmurs / rubs / gallops. No extremity edema. 2+ pedal pulses. No carotid bruits.  Abdomen: no tenderness, no masses palpated. No hepatosplenomegaly. Bowel sounds positive.  Musculoskeletal: no clubbing / cyanosis. No joint deformity upper and lower extremities. Good ROM, no contractures. Normal muscle tone.  Skin: no rashes, lesions, ulcers. No induration Neurologic: CN 2-12 grossly intact. Sensation intact, DTR normal. Strength 5/5 in all 4.  Psychiatric: Normal judgment and insight. Alert and oriented x 3. Normal mood.   Flowsheet Row Office Visit from 09/01/2022 in [facility] [place]  PHQ-9 Total Score 13         Impression and Plan:  Encounter for  preventive health examination  Hyperlipemia, mixed - Plan: Lipid panel  Essential hypertension - Plan: CBC with Differential/Platelet, Comprehensive metabolic panel, Hemoglobin A1c  Tobacco use disorder  Insomnia, unspecified type - Plan: traZODone (DESYREL) 50 MG tablet, TSH, Vitamin B12, VITAMIN D 25 Hydroxy (Vit-D Deficiency, Fractures), zolpidem (AMBIEN) 5 MG tablet  Cervical cancer screening - Plan: Ambulatory referral to Gynecology  -Recommend routine eye and dental care. -Immunizations: All immunizations are up-to-date -Healthy lifestyle discussed in detail. -Labs to be updated today. -Colon cancer screening: Overdue, referral has been placed, she will call to schedule -Breast cancer screening: 03/2022 -Cervical cancer screening: Overdue, GYN referral placed -Lung cancer screening: Not applicable -Prostate cancer screening: Not applicable -DEXA: Not applicable      Asharia Lotter Philip Aspen, MD  Primary Care at [facility]

## 2022-09-15 ENCOUNTER — Other Ambulatory Visit: Payer: 59

## 2022-11-19 ENCOUNTER — Other Ambulatory Visit: Payer: Self-pay | Admitting: Gastroenterology

## 2022-11-19 ENCOUNTER — Encounter: Payer: Self-pay | Admitting: Internal Medicine

## 2022-11-19 ENCOUNTER — Other Ambulatory Visit: Payer: Self-pay | Admitting: Internal Medicine

## 2022-11-19 DIAGNOSIS — G4700 Insomnia, unspecified: Secondary | ICD-10-CM

## 2022-11-19 DIAGNOSIS — Z1211 Encounter for screening for malignant neoplasm of colon: Secondary | ICD-10-CM

## 2022-11-19 NOTE — Telephone Encounter (Signed)
Patient called also requesting refill of traZODone (DESYREL) 50 MG tablet zolpidem (AMBIEN) 5 MG tablet

## 2022-11-25 ENCOUNTER — Other Ambulatory Visit: Payer: 59

## 2023-03-03 ENCOUNTER — Other Ambulatory Visit: Payer: Self-pay | Admitting: Internal Medicine

## 2023-03-03 DIAGNOSIS — G4700 Insomnia, unspecified: Secondary | ICD-10-CM

## 2023-03-15 ENCOUNTER — Other Ambulatory Visit: Payer: Self-pay | Admitting: Internal Medicine

## 2023-03-15 DIAGNOSIS — Z1231 Encounter for screening mammogram for malignant neoplasm of breast: Secondary | ICD-10-CM

## 2023-03-25 ENCOUNTER — Other Ambulatory Visit: Payer: Self-pay | Admitting: Internal Medicine

## 2023-03-25 DIAGNOSIS — L509 Urticaria, unspecified: Secondary | ICD-10-CM

## 2023-03-28 ENCOUNTER — Encounter: Payer: Self-pay | Admitting: Internal Medicine

## 2023-03-29 ENCOUNTER — Other Ambulatory Visit: Payer: Self-pay | Admitting: Internal Medicine

## 2023-03-29 DIAGNOSIS — L509 Urticaria, unspecified: Secondary | ICD-10-CM

## 2023-03-29 MED ORDER — PREDNISONE 10 MG PO TABS
10.0000 mg | ORAL_TABLET | Freq: Every day | ORAL | 1 refills | Status: DC
Start: 2023-03-29 — End: 2023-08-17

## 2023-04-11 ENCOUNTER — Ambulatory Visit
Admission: RE | Admit: 2023-04-11 | Discharge: 2023-04-11 | Disposition: A | Discharge status: Home / Self Care | Payer: 59 | Source: Ambulatory Visit | Attending: Internal Medicine | Admitting: Internal Medicine

## 2023-04-11 DIAGNOSIS — Z1231 Encounter for screening mammogram for malignant neoplasm of breast: Secondary | ICD-10-CM

## 2023-04-14 ENCOUNTER — Other Ambulatory Visit: Payer: Self-pay | Admitting: Internal Medicine

## 2023-04-14 DIAGNOSIS — R928 Other abnormal and inconclusive findings on diagnostic imaging of breast: Secondary | ICD-10-CM

## 2023-04-22 ENCOUNTER — Ambulatory Visit
Admission: RE | Admit: 2023-04-22 | Discharge: 2023-04-22 | Disposition: A | Discharge status: Home / Self Care | Payer: 59 | Source: Ambulatory Visit | Attending: Internal Medicine | Admitting: Internal Medicine

## 2023-04-22 ENCOUNTER — Other Ambulatory Visit: Payer: Self-pay | Admitting: Internal Medicine

## 2023-04-22 DIAGNOSIS — N6310 Unspecified lump in the right breast, unspecified quadrant: Secondary | ICD-10-CM

## 2023-04-22 DIAGNOSIS — R928 Other abnormal and inconclusive findings on diagnostic imaging of breast: Secondary | ICD-10-CM

## 2023-05-10 ENCOUNTER — Ambulatory Visit: Payer: 59

## 2023-05-10 ENCOUNTER — Telehealth: Payer: Self-pay | Admitting: *Deleted

## 2023-05-10 ENCOUNTER — Ambulatory Visit (HOSPITAL_COMMUNITY)
Admission: RE | Admit: 2023-05-10 | Discharge: 2023-05-10 | Disposition: A | Discharge status: Home / Self Care | Payer: 59 | Source: Ambulatory Visit | Attending: Internal Medicine | Admitting: Internal Medicine

## 2023-05-10 ENCOUNTER — Telehealth: Payer: Self-pay | Admitting: Internal Medicine

## 2023-05-10 ENCOUNTER — Ambulatory Visit: Payer: 59 | Admitting: Internal Medicine

## 2023-05-10 ENCOUNTER — Encounter: Payer: Self-pay | Admitting: Internal Medicine

## 2023-05-10 VITALS — BP 140/90 | HR 92 | Temp 97.5°F | Ht 64.5 in | Wt 117.7 lb

## 2023-05-10 DIAGNOSIS — H02846 Edema of left eye, unspecified eyelid: Secondary | ICD-10-CM | POA: Diagnosis not present

## 2023-05-10 DIAGNOSIS — H02843 Edema of right eye, unspecified eyelid: Secondary | ICD-10-CM

## 2023-05-10 DIAGNOSIS — R079 Chest pain, unspecified: Secondary | ICD-10-CM | POA: Diagnosis not present

## 2023-05-10 DIAGNOSIS — R600 Localized edema: Secondary | ICD-10-CM

## 2023-05-10 DIAGNOSIS — E782 Mixed hyperlipidemia: Secondary | ICD-10-CM

## 2023-05-10 DIAGNOSIS — Z0000 Encounter for general adult medical examination without abnormal findings: Secondary | ICD-10-CM

## 2023-05-10 DIAGNOSIS — M79672 Pain in left foot: Secondary | ICD-10-CM | POA: Insufficient documentation

## 2023-05-10 DIAGNOSIS — E781 Pure hyperglyceridemia: Secondary | ICD-10-CM

## 2023-05-10 DIAGNOSIS — M25572 Pain in left ankle and joints of left foot: Secondary | ICD-10-CM | POA: Insufficient documentation

## 2023-05-10 LAB — CBC WITH DIFFERENTIAL/PLATELET
Basophils Absolute: 0.1 10*3/uL (ref 0.0–0.1)
Basophils Relative: 0.7 % (ref 0.0–3.0)
Eosinophils Absolute: 0.1 10*3/uL (ref 0.0–0.7)
Eosinophils Relative: 0.5 % (ref 0.0–5.0)
HCT: 40.4 % (ref 36.0–46.0)
Hemoglobin: 13.5 g/dL (ref 12.0–15.0)
Lymphocytes Relative: 12.6 % (ref 12.0–46.0)
Lymphs Abs: 1.4 10*3/uL (ref 0.7–4.0)
MCHC: 33.3 g/dL (ref 30.0–36.0)
MCV: 110 fl — ABNORMAL HIGH (ref 78.0–100.0)
Monocytes Absolute: 0.7 10*3/uL (ref 0.1–1.0)
Monocytes Relative: 6.7 % (ref 3.0–12.0)
Neutro Abs: 8.8 10*3/uL — ABNORMAL HIGH (ref 1.4–7.7)
Neutrophils Relative %: 79.5 % — ABNORMAL HIGH (ref 43.0–77.0)
Platelets: 428 10*3/uL — ABNORMAL HIGH (ref 150.0–400.0)
RBC: 3.67 Mil/uL — ABNORMAL LOW (ref 3.87–5.11)
RDW: 12.9 % (ref 11.5–15.5)
WBC: 11.1 10*3/uL — ABNORMAL HIGH (ref 4.0–10.5)

## 2023-05-10 LAB — URINALYSIS, ROUTINE W REFLEX MICROSCOPIC
Bilirubin Urine: NEGATIVE
Ketones, ur: NEGATIVE
Nitrite: NEGATIVE
RBC / HPF: NONE SEEN (ref 0–?)
Specific Gravity, Urine: 1.01 (ref 1.000–1.030)
Total Protein, Urine: NEGATIVE
Urine Glucose: NEGATIVE
Urobilinogen, UA: 0.2 (ref 0.0–1.0)
pH: 6 (ref 5.0–8.0)

## 2023-05-10 LAB — COMPREHENSIVE METABOLIC PANEL
ALT: 18 U/L (ref 0–35)
AST: 36 U/L (ref 0–37)
Albumin: 4.3 g/dL (ref 3.5–5.2)
Alkaline Phosphatase: 73 U/L (ref 39–117)
BUN: 8 mg/dL (ref 6–23)
CO2: 25 mEq/L (ref 19–32)
Calcium: 9.7 mg/dL (ref 8.4–10.5)
Chloride: 103 mEq/L (ref 96–112)
Creatinine, Ser: 0.58 mg/dL (ref 0.40–1.20)
GFR: 107.92 mL/min (ref >60.00)
Glucose, Bld: 100 mg/dL — ABNORMAL HIGH (ref 70–99)
Potassium: 3.7 mEq/L (ref 3.5–5.1)
Sodium: 141 mEq/L (ref 135–145)
Total Bilirubin: 0.4 mg/dL (ref 0.2–1.2)
Total Protein: 7.1 g/dL (ref 6.0–8.3)

## 2023-05-10 NOTE — Progress Notes (Signed)
Established Patient Office Visit     CC/Reason for Visit: Discuss acute concerns HPI: Kathleen Rubio is a 47 y.o. female who is coming in today for the above mentioned reasons. Past Medical History is significant for: Hypertension and hyperlipidemia.  I have not seen her in close to a year.  She is here today to discuss a myriad of acute complaints.  She just returned from a trip from [place].  For the past 2 days she has been having left foot pain on the dorsum of her foot around the metatarsal area.  No injury that she can recall.  She was wearing new shoes.  She does not recall rolling her ankle.  She has swelling of both legs but worse of that left 1.  She has also been experiencing bilateral eyelid swelling for a few months.  She feels it has gotten worse.  She has had some clear eye discharge as well.  She has also been experiencing chest pain.  She states that she has had this for over 20 years.  It has been associated with anxiety in the past.  It is over her left anterior chest, no radiation.  She has significant palpitations with it.   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 She has never used smokeless tobacco. She reports current alcohol use of about 4.0 standard drinks of alcohol per week. She reports that she does not use drugs.  Allergies: No Known Allergies  Family History:  Family History  Problem Relation Age of Onset   Breast cancer Mother 23       recurrence at age 63   Cancer Mother 12       head/neck ca STAGE 4- ALSO SOFT PALATE IN  MOUTH-? PRIMARY SOURCE   Diabetes Father        type 2   Prostate cancer Father 55       currently 79   Hyperlipidemia Father    Breast cancer Maternal Grandmother        Dx 49s; deceased 74s   Colon cancer Neg Hx    Colon polyps Neg Hx    Esophageal cancer Neg Hx    Rectal cancer Neg Hx    Stomach cancer Neg Hx      Current Outpatient Medications:    amLODipine (NORVASC) 5 MG tablet, Take 1 tablet (5 mg total) by mouth daily., Disp: 90 tablet, Rfl: 3   amoxicillin (AMOXIL) 500 MG capsule, Take by mouth., Disp: , Rfl:    baclofen (LIORESAL) 10 MG tablet, AS NEEDED, Disp: , Rfl:    EPINEPHRINE 0.3 mg/0.3 mL IJ SOAJ injection, INJECT 0.3 MG INTO THE MUSCLE AS NEEDED FOR ANAPHYLAXIS., Disp: 2 each, Rfl: 1   fenofibrate 54 MG tablet, Take 1 tablet (54 mg total) by mouth daily., Disp: 90 tablet, Rfl: 1   ondansetron (ZOFRAN) 4 MG tablet, Take 1 tablet (4 mg total) by mouth as directed., Disp:  2 tablet, Rfl: 0   predniSONE (DELTASONE) 10 MG tablet, Take 1 tablet (10 mg total) by mouth daily with breakfast. PRN, Disp: 30 tablet, Rfl: 1   Sodium Sulfate-Mag Sulfate-KCl (SUTAB) [phone] MG TABS, Take 24 tablets by mouth as directed. SUTAB COUPON EPP:[id number] PCN:CN GROUP:[id number] MEMBER [id number]; no prior authorization, Disp: 24 tablet, Rfl: 0   traZODone (DESYREL) 50 MG tablet, TAKE 1 TABLET (50 MG TOTAL) BY MOUTH AT BEDTIME AS NEEDED, Disp: 90 tablet, Rfl: 1   valACYclovir (VALTREX) 1000 MG tablet, TAKE 1 TABLET (1,000 MG TOTAL) BY MOUTH 2 (TWO) TIMES DAILY., Disp: 90 tablet, Rfl: 1   zolpidem (AMBIEN) 5 MG tablet, TAKE 1 TABLET BY MOUTH AT BEDTIME AS NEEDED FOR SLEEP., Disp: 15 tablet, Rfl: 1   zonisamide (ZONEGRAN) 25 MG capsule, , Disp: , Rfl:   Review of Systems:  Negative unless indicated in HPI.   Physical Exam: Vitals:   05/10/23 0927 05/10/23 0928  BP: (!) 150/80 (!) 140/90  Pulse: 92   Temp: (!) 97.5 F (36.4 C)   TempSrc: Oral   SpO2: 97%   Weight: 117 lb 11.2  oz (53.4 kg)   Height: 5' 4.5" (1.638 m)     Body mass index is 19.89 kg/m.   Physical Exam Vitals reviewed.  Constitutional:      Appearance: Normal appearance.  HENT:     Head: Normocephalic and atraumatic.  Eyes:     Conjunctiva/sclera: Conjunctivae normal.     Pupils: Pupils are equal, round, and reactive to light.  Cardiovascular:     Rate and Rhythm: Normal rate and regular rhythm.  Pulmonary:     Effort: Pulmonary effort is normal.     Breath sounds: Normal breath sounds.  Musculoskeletal:     Comments: Swelling at the dorsum of the left foot around metatarsal area.  2+ edema on left foot and calf, 1+ edema on right calf.  Skin:    General: Skin is warm and dry.  Neurological:     General: No focal deficit present.     Mental Status: She is alert and oriented to person, place, and time.  Psychiatric:        Mood and Affect: Mood normal.        Behavior: Behavior normal.        Thought Content: Thought content normal.        Judgment: Judgment normal.     Impression and Plan:  Hyperlipemia, mixed  Encounter for preventive health examination  Hypertriglyceridemia  Chest pain, unspecified type -     EKG 12-Lead -     Ambulatory referral to Cardiology  Edema of eyelid of both eyes -     CBC with Differential/Platelet; Future -     Comprehensive metabolic panel; Future -     Urinalysis, Routine w reflex microscopic  Edema of left foot -     DG Foot Complete Left; Future   -EKG done in office today and interpreted by myself as normal sinus rhythm at a rate of 86, normal axis, no acute ischemic changes, no extrasystolic beats. -Given this chest pain is a longstanding issue I will refer to cardiology.  I think this would be very atypical for coronary artery disease but she does have some risk factors.  In addition, because she has significant palpitations I wonder if she would benefit from an event monitor. -I am concerned that her bilateral eyelid swelling  might be related to a hypoalbuminemic state.  I will check  labs and a urine analysis today. -With her recent travel and left greater than right lower extremity edema I am concerned for DVT, I will send for stat Doppler.  Will also check x-ray given foot edema seems very localized around the metatarsal area to rule out a stress fracture.   Time spent:34 minutes reviewing chart, interviewing and examining patient and formulating plan of care.     Chaya Jan, MD [facility] [address] [facility]

## 2023-05-10 NOTE — Telephone Encounter (Signed)
CRITICAL VALUE STICKER  CRITICAL VALUE: negative DVT.  Fluid around the Left ankle  RECEIVER (on-site recipient of call):Tamberly Pomplun CMA  DATE & TIME NOTIFIED: 05/10/23  MESSENGER (representative from lab):  MD NOTIFIED: Ardyth Harps A  TIME OF NOTIFICATION: 12:01  RESPONSE:  per Dr Ardyth Harps,  patient to go home and try Tylenol and Ibuprofen.   Patient will be notified of xray results.

## 2023-05-10 NOTE — Telephone Encounter (Signed)
ERROR

## 2023-05-12 ENCOUNTER — Other Ambulatory Visit: Payer: Self-pay | Admitting: Internal Medicine

## 2023-05-12 DIAGNOSIS — Z1231 Encounter for screening mammogram for malignant neoplasm of breast: Secondary | ICD-10-CM

## 2023-05-12 DIAGNOSIS — Z1211 Encounter for screening for malignant neoplasm of colon: Secondary | ICD-10-CM

## 2023-05-12 NOTE — Telephone Encounter (Signed)
Pt called, returning CMA's call. CMA was with a patient. Pt asked that CMA call back at her earliest convenience. 

## 2023-05-12 NOTE — Telephone Encounter (Signed)
Reviewed lab results with patient.

## 2023-05-19 ENCOUNTER — Encounter: Payer: Self-pay | Admitting: Internal Medicine

## 2023-05-19 DIAGNOSIS — Z124 Encounter for screening for malignant neoplasm of cervix: Secondary | ICD-10-CM

## 2023-05-20 ENCOUNTER — Encounter: Payer: Self-pay | Admitting: Internal Medicine

## 2023-05-25 ENCOUNTER — Other Ambulatory Visit: Payer: Self-pay | Admitting: Internal Medicine

## 2023-05-25 DIAGNOSIS — L509 Urticaria, unspecified: Secondary | ICD-10-CM

## 2023-05-27 ENCOUNTER — Other Ambulatory Visit: Payer: Self-pay | Admitting: Medical Genetics

## 2023-05-27 DIAGNOSIS — Z006 Encounter for examination for normal comparison and control in clinical research program: Secondary | ICD-10-CM

## 2023-06-22 ENCOUNTER — Encounter: Payer: Self-pay | Admitting: Family Medicine

## 2023-06-22 ENCOUNTER — Telehealth: Payer: Self-pay | Admitting: Internal Medicine

## 2023-06-22 ENCOUNTER — Ambulatory Visit: Payer: 59 | Admitting: Internal Medicine

## 2023-06-22 ENCOUNTER — Ambulatory Visit: Payer: 59 | Admitting: Family Medicine

## 2023-06-22 VITALS — BP 142/90 | HR 105 | Temp 98.4°F | Wt 116.6 lb

## 2023-06-22 DIAGNOSIS — M25471 Effusion, right ankle: Secondary | ICD-10-CM

## 2023-06-22 DIAGNOSIS — M25472 Effusion, left ankle: Secondary | ICD-10-CM | POA: Diagnosis not present

## 2023-06-22 DIAGNOSIS — R000 Tachycardia, unspecified: Secondary | ICD-10-CM

## 2023-06-22 DIAGNOSIS — I10 Essential (primary) hypertension: Secondary | ICD-10-CM

## 2023-06-22 MED ORDER — METOPROLOL SUCCINATE ER 50 MG PO TB24: 50.0000 mg | ORAL_TABLET | Freq: Every day | ORAL | 3 refills | Status: DC

## 2023-06-22 MED ORDER — FUROSEMIDE 20 MG PO TABS: 20.0000 mg | ORAL_TABLET | Freq: Every day | ORAL | 3 refills | Status: DC | PRN

## 2023-06-22 NOTE — Telephone Encounter (Signed)
Patient states Dr. Clent Ridges cleared her to transfer to him.  Is this ok with Clent Ridges and Ardyth Harps?

## 2023-06-22 NOTE — Telephone Encounter (Signed)
Yes I agreed to see her  

## 2023-06-22 NOTE — Progress Notes (Signed)
   Subjective:    Patient ID: Kathleen Rubio, female    DOB: Oct 19, 1975, 47 y.o.   MRN: [id number]  HPI Here for intermittent swelling in both feet and ankles that started about one year ago. This started about the same time that she was started on Amlodipine for HTN. Usually the fluid is minimal, and it goes away overnight while she is in bed. However she travels a lot for her job, and when she flies the swelling is more pronounced and it can be painful. She wears compression stockings when she flies but these don not help much. No SOB. She had labs in August showing  normal renal function. She notes that her BP at home is usually slightly high, and her heart rates tend to be high.    Review of Systems  Constitutional: Negative.   Respiratory: Negative.    Cardiovascular:  Positive for leg swelling. Negative for chest pain and palpitations.       Objective:   Physical Exam Constitutional:      Appearance: Normal appearance. She is not ill-appearing.  Cardiovascular:     Rate and Rhythm: Regular rhythm. Tachycardia present.     Pulses: Normal pulses.     Heart sounds: Normal heart sounds.  Pulmonary:     Effort: Pulmonary effort is normal.     Breath sounds: Normal breath sounds.  Musculoskeletal:     Right lower leg: No edema.     Left lower leg: No edema.  Neurological:     Mental Status: She is alert.           Assessment & Plan:  She has some ankle edema that is likely a side effect of the Amlodipine. We agreed to stop that, and instead to take Metoprolol succinate 50 mg once daily. Hopefully this will stop the ankle swelling, and both her BP and rapid heart rate will be better controlled. We will also give her some Lasix 20 mg to take as needed when she travels. Report back in 3-4 weeks.  Gershon Crane, MD

## 2023-06-23 ENCOUNTER — Ambulatory Visit (AMBULATORY_SURGERY_CENTER): Payer: 59 | Admitting: *Deleted

## 2023-06-23 ENCOUNTER — Encounter: Payer: Self-pay | Admitting: Internal Medicine

## 2023-06-23 VITALS — Ht 64.5 in | Wt 116.0 lb

## 2023-06-23 DIAGNOSIS — Z1211 Encounter for screening for malignant neoplasm of colon: Secondary | ICD-10-CM

## 2023-06-23 MED ORDER — NA SULFATE-K SULFATE-MG SULF 17.5-3.13-1.6 GM/177ML PO SOLN
1.0000 | Freq: Once | ORAL | 0 refills | Status: AC
Start: 2023-06-23 — End: 2023-06-23

## 2023-06-23 NOTE — Progress Notes (Signed)
Pt's name and DOB verified at the beginning of the pre-visit wit 2 identifiers  Pt denies any difficulty with ambulating,sitting, laying down or rolling side to side  Gave both [facility] main # and MD on call # prior to instructions.   No egg or soy allergy known to patient   No issues known to pt with past sedation with any surgeries or procedures  Pt denies having issues being intubated  Pt has no issues moving head neck or swallowing  No FH of Malignant Hyperthermia  Pt is not on diet pills or shots  Pt is not on home 02   Pt is not on blood thinners   Pt denies issues with constipation   Pt is not on dialysis  Pt denise any abnormal heart rhythms   Pt denies any upcoming cardiac testing  Pt encouraged to use to use Singlecare or Goodrx to reduce cost   Patient's chart reviewed by Cathlyn Parsons CNRA prior to pre-visit and patient appropriate for the [facility].  Pre-visit completed and red dot placed by patient's name on their procedure day (on provider's schedule).  .  Visit by phone  Pt states weight is 116 lb  Instructed pt why it is important to and  to call if they have any changes in health or new medications. Directed them to the # given and on instructions.     Instructions reviewed with pt and pt states understanding. Instructed to review again prior to procedure. Pt states they will.   Instructions sent by mail with coupon and by my chart

## 2023-06-28 NOTE — Telephone Encounter (Signed)
Pt notified, cancelled CPE with Dr Ardyth Harps to Dr Clent Ridges on 08/31/23

## 2023-06-30 ENCOUNTER — Encounter: Payer: Self-pay | Admitting: Nurse Practitioner

## 2023-06-30 ENCOUNTER — Other Ambulatory Visit (HOSPITAL_COMMUNITY)
Admission: RE | Admit: 2023-06-30 | Discharge: 2023-06-30 | Disposition: A | Discharge status: Home / Self Care | Payer: 59 | Source: Ambulatory Visit | Attending: Nurse Practitioner | Admitting: Nurse Practitioner

## 2023-06-30 ENCOUNTER — Ambulatory Visit: Payer: 59 | Admitting: Nurse Practitioner

## 2023-06-30 VITALS — BP 118/84 | HR 107 | Ht 64.75 in | Wt 117.0 lb

## 2023-06-30 DIAGNOSIS — Z30013 Encounter for initial prescription of injectable contraceptive: Secondary | ICD-10-CM | POA: Diagnosis not present

## 2023-06-30 DIAGNOSIS — Z01419 Encounter for gynecological examination (general) (routine) without abnormal findings: Secondary | ICD-10-CM | POA: Diagnosis not present

## 2023-06-30 DIAGNOSIS — Z124 Encounter for screening for malignant neoplasm of cervix: Secondary | ICD-10-CM

## 2023-06-30 DIAGNOSIS — Z3009 Encounter for other general counseling and advice on contraception: Secondary | ICD-10-CM

## 2023-06-30 DIAGNOSIS — N898 Other specified noninflammatory disorders of vagina: Secondary | ICD-10-CM | POA: Diagnosis not present

## 2023-06-30 DIAGNOSIS — N951 Menopausal and female climacteric states: Secondary | ICD-10-CM | POA: Diagnosis not present

## 2023-06-30 DIAGNOSIS — N941 Unspecified dyspareunia: Secondary | ICD-10-CM

## 2023-06-30 LAB — PREGNANCY, URINE: Preg Test, Ur: NEGATIVE

## 2023-06-30 MED ORDER — ESTRADIOL 0.1 MG/GM VA CREA
1.0000 g | TOPICAL_CREAM | VAGINAL | 0 refills | Status: DC
Start: 2023-06-30 — End: 2023-09-30

## 2023-06-30 MED ORDER — VENLAFAXINE HCL ER 37.5 MG PO CP24
37.5000 mg | ORAL_CAPSULE | Freq: Every day | ORAL | 0 refills | Status: DC
Start: 2023-06-30 — End: 2023-08-17

## 2023-06-30 MED ORDER — MEDROXYPROGESTERONE ACETATE 150 MG/ML IM SUSY
150.0000 mg | PREFILLED_SYRINGE | Freq: Once | INTRAMUSCULAR | Status: AC
Start: 2023-06-30 — End: 2023-06-30
  Administered 2023-06-30: 150 mg via INTRAMUSCULAR

## 2023-06-30 NOTE — Progress Notes (Signed)
Kathleen Rubio 1976-08-05 [id number]   History:  47 y.o. G0 presents as new patient to establish care. Cycles occur monthly but are slightly irregular. Has skipped months here and there. Complains of hot flashes, insomnia, anxiety and vaginal dryness/painful intercourse. Interested in restarting contraception to help with bleeding and symptoms. Has done depo in the past. Normal pap history. H/O HTN, HLD. Smoker.   Gynecologic History No LMP recorded. Period Duration (Days): 5-7 Menstrual Flow: Light Menstrual Control: Tampon Dysmenorrhea: (!) Mild (mild to mod) Dysmenorrhea Symptoms: Cramping Contraception/Family planning: none Sexually active: Yes  Health Maintenance Last Pap: 02/01/2018. Results were: Normal Last mammogram: 04/11/2023. Results were: Possible bilateral asymmetry, follow up imaging 04/22/23 showed probable complex cyst right breast, benign cysts left breast, 73-month follow up recommended Last colonoscopy: Never. Scheduled next week Last Dexa: Not indicated  Past medical history, past surgical history, family history and social history were all reviewed and documented in the EPIC chart. Boyfriend of 10 years, long distance. Works remote in Product/process development scientist. Mother and MGM with breast cancer in their 30s.   ROS:  A ROS was performed and pertinent positives and negatives are included.  Exam:  Vitals:   06/30/23 1134  BP: 118/84  Pulse: (!) 107  SpO2: 98%  Weight: 117 lb (53.1 kg)  Height: 5' 4.75" (1.645 m)   Body mass index is 19.62 kg/m.  General appearance:  Normal Thyroid:  Symmetrical, normal in size, without palpable masses or nodularity. Respiratory  Auscultation:  Clear without wheezing or rhonchi Cardiovascular  Auscultation:  Regular rate, without rubs, murmurs or gallops  Edema/varicosities:  Not grossly evident Abdominal  Soft,nontender, without masses, guarding or rebound.  Liver/spleen:  No organomegaly noted  Hernia:  None appreciated   Skin  Inspection:  Grossly normal Breasts: Examined lying and sitting.   Right: Without masses, retractions, nipple discharge or axillary adenopathy.   Left: Without masses, retractions, nipple discharge or axillary adenopathy. Pelvic: External genitalia:  no lesions              Urethra:  normal appearing urethra with no masses, tenderness or lesions              Bartholins and Skenes: normal                 Vagina: normal appearing vagina with normal color and discharge, no lesions              Cervix: no lesions Bimanual Exam:  Uterus:  no masses or tenderness              Adnexa: no mass, fullness, tenderness              Rectovaginal: Deferred              Anus:  normal, no lesions  Patient informed chaperone available to be present for breast and pelvic exam. Patient has requested no chaperone to be present. Patient has been advised what will be completed during breast and pelvic exam.   UPT neg  Assessment/Plan:  47 y.o. G0 to establish care.   Well female exam with routine gynecological exam - Education provided on SBEs, importance of preventative screenings, current guidelines, high calcium diet, regular exercise, and multivitamin daily. Labs with PCP.   Cervical cancer screening - Plan: Cytology - PAP( [facility]). Normal pap history.   Perimenopausal symptoms - Plan: venlafaxine XR (EFFEXOR XR) 37.5 MG 24 hr capsule daily. Educated on initial side effects and length of time  for full effectiveness.   General counseling and advice on female contraception - Contraceptive options were reviewed, including hormonal methods, both combination (pill, patch, vaginal ring) and progesterone-only (pill, Depo Provera and Nexplanon), intrauterine devices (Mirena, [place], Bee, and [place]), Phexxi, barrier methods (condoms, diaphragm) and female/female sterilization. The mechanisms, risks, benefits and side effects of all methods were discussed. Smoker, so progestin-only recommended. Would  like to restart Depo.   Encounter for initial prescription of injectable contraceptive - Plan: Pregnancy, urine, medroxyPROGESTERone Acetate SUSY 150 mg every 90 days.   Vaginal dryness - Plan: estradiol (ESTRACE VAGINAL) 0.1 MG/GM vaginal cream twice weekly. Use nightly x 1 week initially, then decrease to twice weekly.   Dyspareunia in female - Plan: estradiol (ESTRACE VAGINAL) 0.1 MG/GM vaginal cream twice weekly. Use nightly x 1 week initially, then decrease to twice weekly.  Screening for breast cancer - being followed for complex cyst. 84-month repeat (due in November).   Screening for colon cancer - Colonoscopy scheduled next week.   Screening for osteoporosis - Average risk. Will plan DXA at age 60.   Return in about 6 weeks (around 08/11/2023) for for med follow up, 1 year for annual.    Olivia Mackie DNP, 1:30 PM 06/30/2023

## 2023-07-04 LAB — CYTOLOGY - PAP
Comment: NEGATIVE
Diagnosis: NEGATIVE
High risk HPV: POSITIVE — AB

## 2023-07-07 ENCOUNTER — Encounter: Payer: 59 | Admitting: Internal Medicine

## 2023-07-29 ENCOUNTER — Ambulatory Visit: Payer: 59 | Attending: Cardiovascular Disease | Admitting: Cardiovascular Disease

## 2023-08-01 ENCOUNTER — Encounter: Payer: Self-pay | Admitting: Cardiovascular Disease

## 2023-08-01 ENCOUNTER — Encounter: Payer: Self-pay | Admitting: Nurse Practitioner

## 2023-08-08 ENCOUNTER — Telehealth: Payer: Self-pay | Admitting: Nurse Practitioner

## 2023-08-08 NOTE — Telephone Encounter (Signed)
Patient was seen on October 10 and needed to return for a 6 week follow up appointment. Two messages left and a letter mailed asking patient to call and schedule appointment; patient has not returned our call. Please advise.

## 2023-08-08 NOTE — Telephone Encounter (Signed)
Can close encounter. Refills will be denied until appt is scheduled. Thanks.

## 2023-08-16 ENCOUNTER — Other Ambulatory Visit: Payer: Self-pay | Admitting: Internal Medicine

## 2023-08-16 ENCOUNTER — Other Ambulatory Visit: Payer: Self-pay | Admitting: Nurse Practitioner

## 2023-08-16 ENCOUNTER — Encounter: Payer: Self-pay | Admitting: Family Medicine

## 2023-08-16 DIAGNOSIS — N951 Menopausal and female climacteric states: Secondary | ICD-10-CM

## 2023-08-16 DIAGNOSIS — G4700 Insomnia, unspecified: Secondary | ICD-10-CM

## 2023-08-16 DIAGNOSIS — L509 Urticaria, unspecified: Secondary | ICD-10-CM

## 2023-08-17 NOTE — Telephone Encounter (Signed)
Med refill request: venlafaxine XR 37.5 mg Last AEX: 06/30/23 Follow up due 08/11/23, no appointment scheduled Last MMG (if hormonal med) n/a Refill authorized: venlafaxine XR 37.5 mg #30, needs office visit for further refills.  Sent to provider for review.

## 2023-08-17 NOTE — Telephone Encounter (Signed)
Pt LOV 06/22/2023

## 2023-08-31 ENCOUNTER — Ambulatory Visit: Payer: 59 | Admitting: Family Medicine

## 2023-08-31 ENCOUNTER — Encounter: Payer: Self-pay | Admitting: Family Medicine

## 2023-08-31 VITALS — BP 138/86 | HR 96 | Temp 98.7°F | Ht 64.75 in | Wt 121.4 lb

## 2023-08-31 DIAGNOSIS — Z0000 Encounter for general adult medical examination without abnormal findings: Secondary | ICD-10-CM

## 2023-08-31 DIAGNOSIS — E781 Pure hyperglyceridemia: Secondary | ICD-10-CM

## 2023-08-31 DIAGNOSIS — G4700 Insomnia, unspecified: Secondary | ICD-10-CM | POA: Diagnosis not present

## 2023-08-31 LAB — CBC WITH DIFFERENTIAL/PLATELET
Basophils Absolute: 0.1 10*3/uL (ref 0.0–0.1)
Basophils Relative: 1.1 % (ref 0.0–3.0)
Eosinophils Absolute: 0 10*3/uL (ref 0.0–0.7)
Eosinophils Relative: 0.6 % (ref 0.0–5.0)
HCT: 38.3 % (ref 36.0–46.0)
Hemoglobin: 13.5 g/dL (ref 12.0–15.0)
Lymphocytes Relative: 23 % (ref 12.0–46.0)
Lymphs Abs: 1.8 10*3/uL (ref 0.7–4.0)
MCHC: 35.1 g/dL (ref 30.0–36.0)
MCV: 109.9 fL — ABNORMAL HIGH (ref 78.0–100.0)
Monocytes Absolute: 0.6 10*3/uL (ref 0.1–1.0)
Monocytes Relative: 7.2 % (ref 3.0–12.0)
Neutro Abs: 5.3 10*3/uL (ref 1.4–7.7)
Neutrophils Relative %: 68.1 % (ref 43.0–77.0)
Platelets: 383 10*3/uL (ref 150.0–400.0)
RBC: 3.48 Mil/uL — ABNORMAL LOW (ref 3.87–5.11)
RDW: 12.8 % (ref 11.5–15.5)
WBC: 7.8 10*3/uL (ref 4.0–10.5)

## 2023-08-31 LAB — BASIC METABOLIC PANEL
BUN: 8 mg/dL (ref 6–23)
CO2: 22 meq/L (ref 19–32)
Calcium: 9.4 mg/dL (ref 8.4–10.5)
Chloride: 104 meq/L (ref 96–112)
Creatinine, Ser: 0.58 mg/dL (ref 0.40–1.20)
GFR: 107.68 mL/min (ref >60.00)
Glucose, Bld: 111 mg/dL — ABNORMAL HIGH (ref 70–99)
Potassium: 4 meq/L (ref 3.5–5.1)
Sodium: 138 meq/L (ref 135–145)

## 2023-08-31 LAB — LIPID PANEL
Cholesterol: 335 mg/dL — ABNORMAL HIGH (ref 0–200)
HDL: 80.4 mg/dL (ref >39.00)
Total CHOL/HDL Ratio: 4
Triglycerides: 838 mg/dL — ABNORMAL HIGH (ref 0.0–149.0)

## 2023-08-31 LAB — LDL CHOLESTEROL, DIRECT: Direct LDL: 110 mg/dL

## 2023-08-31 LAB — HEPATIC FUNCTION PANEL
ALT: 12 U/L (ref 0–35)
AST: 17 U/L (ref 0–37)
Albumin: 4.3 g/dL (ref 3.5–5.2)
Alkaline Phosphatase: 59 U/L (ref 39–117)
Bilirubin, Direct: 0.1 mg/dL (ref 0.0–0.3)
Total Bilirubin: 0.8 mg/dL (ref 0.2–1.2)
Total Protein: 6.8 g/dL (ref 6.0–8.3)

## 2023-08-31 LAB — TSH: TSH: 0.81 u[IU]/mL (ref 0.35–5.50)

## 2023-08-31 LAB — HEMOGLOBIN A1C: Hgb A1c MFr Bld: 5.2 % (ref 4.6–6.5)

## 2023-08-31 MED ORDER — METOPROLOL SUCCINATE ER 50 MG PO TB24: 50.0000 mg | ORAL_TABLET | Freq: Every day | ORAL | 3 refills | Status: DC

## 2023-08-31 MED ORDER — FENOFIBRATE 54 MG PO TABS
54.0000 mg | ORAL_TABLET | Freq: Every day | ORAL | 3 refills | Status: AC
Start: 2023-08-31 — End: ?

## 2023-08-31 MED ORDER — FUROSEMIDE 20 MG PO TABS: 20.0000 mg | ORAL_TABLET | Freq: Every day | ORAL | 3 refills | Status: DC | PRN

## 2023-08-31 MED ORDER — ZOLPIDEM TARTRATE 5 MG PO TABS
5.0000 mg | ORAL_TABLET | Freq: Every evening | ORAL | 5 refills | Status: DC | PRN
Start: 2023-08-31 — End: 2023-12-12

## 2023-08-31 MED ORDER — MEDROXYPROGESTERONE ACETATE 150 MG/ML IM SUSP: 150.0000 mg | INTRAMUSCULAR | Status: DC

## 2023-08-31 NOTE — Progress Notes (Signed)
   Subjective:    Patient ID: Kathleen Rubio, female    DOB: 10/22/1975, 47 y.o.   MRN: [id number]  HPI Here for a well exam. She feels fine. She recently saw her GYN for an exam Wyline Beady NP). She had her first DepoProvera injection in October.    Review of Systems  Constitutional: Negative.   HENT: Negative.    Eyes: Negative.   Respiratory: Negative.    Cardiovascular: Negative.   Gastrointestinal: Negative.   Genitourinary:  Negative for decreased urine volume, difficulty urinating, dyspareunia, dysuria, enuresis, flank pain, frequency, hematuria, pelvic pain and urgency.  Musculoskeletal: Negative.   Skin: Negative.   Neurological: Negative.  Negative for headaches.  Psychiatric/Behavioral: Negative.         Objective:   Physical Exam Constitutional:      General: She is not in acute distress.    Appearance: Normal appearance. She is well-developed.  HENT:     Head: Normocephalic and atraumatic.     Right Ear: External ear normal.     Left Ear: External ear normal.     Nose: Nose normal.     Mouth/Throat:     Pharynx: No oropharyngeal exudate.  Eyes:     General: No scleral icterus.    Conjunctiva/sclera: Conjunctivae normal.     Pupils: Pupils are equal, round, and reactive to light.  Neck:     Thyroid: No thyromegaly.     Vascular: No JVD.  Cardiovascular:     Rate and Rhythm: Normal rate and regular rhythm.     Pulses: Normal pulses.     Heart sounds: Normal heart sounds. No murmur heard.    No friction rub. No gallop.  Pulmonary:     Effort: Pulmonary effort is normal. No respiratory distress.     Breath sounds: Normal breath sounds. No wheezing or rales.  Chest:     Chest wall: No tenderness.  Abdominal:     General: Bowel sounds are normal. There is no distension.     Palpations: Abdomen is soft. There is no mass.     Tenderness: There is no abdominal tenderness. There is no guarding or rebound.  Musculoskeletal:        General: No tenderness. Normal  range of motion.     Cervical back: Normal range of motion and neck supple.  Lymphadenopathy:     Cervical: No cervical adenopathy.  Skin:    General: Skin is warm and dry.     Findings: No erythema or rash.  Neurological:     General: No focal deficit present.     Mental Status: She is alert and oriented to person, place, and time.     Cranial Nerves: No cranial nerve deficit.     Motor: No abnormal muscle tone.     Coordination: Coordination normal.     Deep Tendon Reflexes: Reflexes are normal and symmetric. Reflexes normal.  Psychiatric:        Mood and Affect: Mood normal.        Behavior: Behavior normal.        Thought Content: Thought content normal.        Judgment: Judgment normal.           Assessment & Plan:  Well exam. We discussed diet and exercise. Get fasting labs. Set up a colonoscopy.  Gershon Crane, MD

## 2023-09-01 MED ORDER — FENOFIBRATE 160 MG PO TABS: 160.0000 mg | ORAL_TABLET | Freq: Every day | ORAL | 3 refills | Status: DC

## 2023-09-01 NOTE — Addendum Note (Signed)
Addended by: Johnella Moloney on: 09/01/2023 10:54 AM   Modules accepted: Orders

## 2023-09-07 ENCOUNTER — Encounter: Payer: 59 | Admitting: Internal Medicine

## 2023-09-28 ENCOUNTER — Other Ambulatory Visit: Payer: Self-pay | Admitting: Nurse Practitioner

## 2023-09-28 DIAGNOSIS — N941 Unspecified dyspareunia: Secondary | ICD-10-CM

## 2023-09-28 DIAGNOSIS — N898 Other specified noninflammatory disorders of vagina: Secondary | ICD-10-CM

## 2023-09-29 NOTE — Telephone Encounter (Signed)
 Med refill request:estradiol vaginal cream  Last AEX: 06/30/23 -TW Next AEX: Not scheduled  Last MMG (if hormonal med) Dx MMG 04/22/23, short term f/u of right breast scheduled 10/25/23  Refill authorized: Please Advise?

## 2023-10-04 ENCOUNTER — Encounter: Payer: Self-pay | Admitting: Nurse Practitioner

## 2023-10-04 ENCOUNTER — Encounter: Payer: Self-pay | Admitting: Family Medicine

## 2023-10-05 ENCOUNTER — Ambulatory Visit: Payer: 59

## 2023-10-06 ENCOUNTER — Ambulatory Visit (INDEPENDENT_AMBULATORY_CARE_PROVIDER_SITE_OTHER): Payer: 59

## 2023-10-06 DIAGNOSIS — Z3042 Encounter for surveillance of injectable contraceptive: Secondary | ICD-10-CM | POA: Diagnosis not present

## 2023-10-06 MED ORDER — MEDROXYPROGESTERONE ACETATE 150 MG/ML IM SUSY
150.0000 mg | PREFILLED_SYRINGE | Freq: Once | INTRAMUSCULAR | Status: AC
  Administered 2023-10-06: 150 mg via INTRAMUSCULAR

## 2023-10-06 NOTE — Progress Notes (Signed)
Depo Provera injection given IM RUOQ.  Next injection is due 12/22/23-01/05/24.  Last AEX 06/30/23 with TW.

## 2023-10-25 ENCOUNTER — Other Ambulatory Visit: Payer: Self-pay | Admitting: Radiology

## 2023-10-25 ENCOUNTER — Ambulatory Visit
Admission: RE | Admit: 2023-10-25 | Discharge: 2023-10-25 | Disposition: A | Discharge status: Home / Self Care | Payer: 59 | Source: Ambulatory Visit | Attending: Internal Medicine | Admitting: Internal Medicine

## 2023-10-25 DIAGNOSIS — N941 Unspecified dyspareunia: Secondary | ICD-10-CM

## 2023-10-25 DIAGNOSIS — N898 Other specified noninflammatory disorders of vagina: Secondary | ICD-10-CM

## 2023-10-25 DIAGNOSIS — N6310 Unspecified lump in the right breast, unspecified quadrant: Secondary | ICD-10-CM

## 2023-10-25 NOTE — Telephone Encounter (Signed)
 Medication refill request: estrace  vaginal cream Last AEX:  06-30-23 Next AEX: not scheduled Last MMG (if hormonal medication request): bilateral 04-11-23 & 10-25-23 rt breast u/s birads 3: probably benign Refill authorized: rx was sent 10-03-23 42.5 grams with 0 refills. Please approve or deny as appropriate

## 2023-10-26 ENCOUNTER — Other Ambulatory Visit: Payer: Self-pay | Admitting: Nurse Practitioner

## 2023-10-26 ENCOUNTER — Other Ambulatory Visit: Payer: Self-pay | Admitting: Family Medicine

## 2023-10-26 DIAGNOSIS — N6310 Unspecified lump in the right breast, unspecified quadrant: Secondary | ICD-10-CM

## 2023-10-26 DIAGNOSIS — N951 Menopausal and female climacteric states: Secondary | ICD-10-CM

## 2023-10-26 NOTE — Telephone Encounter (Signed)
 Medication refill request: venlafaxine  XR 37.5mg  Last AEX:  06-30-23 Next AEX: not scheduled Last MMG (if hormonal medication request): n/a Refill authorized: pt did not come for 6 week f/u. Several messages left for patient. Please approve or deny as appropriate

## 2023-11-17 ENCOUNTER — Telehealth: Payer: 59 | Admitting: Physician Assistant

## 2023-11-17 DIAGNOSIS — R6889 Other general symptoms and signs: Secondary | ICD-10-CM

## 2023-11-17 DIAGNOSIS — Z20828 Contact with and (suspected) exposure to other viral communicable diseases: Secondary | ICD-10-CM

## 2023-11-17 MED ORDER — OSELTAMIVIR PHOSPHATE 75 MG PO CAPS: 75.0000 mg | ORAL_CAPSULE | Freq: Two times a day (BID) | ORAL | 0 refills | Status: DC

## 2023-11-17 NOTE — Progress Notes (Signed)
 I have spent 5 minutes in review of e-visit questionnaire, review and updating patient chart, medical decision making and response to patient.   Piedad Climes, PA-C

## 2023-11-17 NOTE — Progress Notes (Signed)
 Message sent to patient requesting further input regarding current symptoms. Awaiting patient response.

## 2023-11-17 NOTE — Progress Notes (Signed)

## 2023-11-18 ENCOUNTER — Encounter: Payer: Self-pay | Admitting: Internal Medicine

## 2023-12-05 ENCOUNTER — Other Ambulatory Visit: Payer: 59

## 2023-12-05 ENCOUNTER — Telehealth: Admitting: Physician Assistant

## 2023-12-05 DIAGNOSIS — J069 Acute upper respiratory infection, unspecified: Secondary | ICD-10-CM

## 2023-12-05 NOTE — Progress Notes (Signed)
 I have spent 5 minutes in review of e-visit questionnaire, review and updating patient chart, medical decision making and response to patient.   Piedad Climes, PA-C

## 2023-12-05 NOTE — Progress Notes (Signed)
 E-Visit for Upper Respiratory Infection  ? ?We are sorry you are not feeling well.  Here is how we plan to help! ? ?Based on what you have shared with me, it looks like you may have a viral upper respiratory infection.  Upper respiratory infections are caused by a large number of viruses; however, rhinovirus is the most common cause.  ? ?Symptoms vary from person to person, with common symptoms including sore throat, cough, fatigue or lack of energy and feeling of general discomfort.  A low-grade fever of up to 100.4 may present, but is often uncommon.  Symptoms vary however, and are closely related to a person's age or underlying illnesses.  The most common symptoms associated with an upper respiratory infection are nasal discharge or congestion, cough, sneezing, headache and pressure in the ears and face.  These symptoms usually persist for about 3 to 10 days, but can last up to 2 weeks.  It is important to know that upper respiratory infections do not cause serious illness or complications in most cases.   ? ?Upper respiratory infections can be transmitted from person to person, with the most common method of transmission being a person's hands.  The virus is able to live on the skin and can infect other persons for up to 2 hours after direct contact.  Also, these can be transmitted when someone coughs or sneezes; thus, it is important to cover the mouth to reduce this risk.  To keep the spread of the illness at bay, good hand hygiene is very important. ? ?This is an infection that is most likely caused by a virus. There are no specific treatments other than to help you with the symptoms until the infection runs its course.  We are sorry you are not feeling well.  Here is how we plan to help! ? ? ?For nasal congestion, you may use an oral decongestants such as Mucinex D or if you have glaucoma or high blood pressure use plain Mucinex.  Saline nasal spray or nasal drops can help and can safely be used as often as  needed for congestion.  ? ?If you do not have a history of heart disease, hypertension, diabetes or thyroid disease, prostate/bladder issues or glaucoma, you may also use Sudafed to treat nasal congestion.  It is highly recommended that you consult with a pharmacist or your primary care physician to ensure this medication is safe for you to take.    ? ?If you have a cough, you may use cough suppressants such as Delsym and Robitussin.  If you have glaucoma or high blood pressure, you can also use Coricidin HBP.   ? ? ?If you have a sore or scratchy throat, use a saltwater gargle- ? to ? teaspoon of salt dissolved in a 4-ounce to 8-ounce glass of warm water.  Gargle the solution for approximately 15-30 seconds and then spit.  It is important not to swallow the solution.  You can also use throat lozenges/cough drops and Chloraseptic spray to help with throat pain or discomfort.  Warm or cold liquids can also be helpful in relieving throat pain. ? ?For headache, pain or general discomfort, you can use Ibuprofen or Tylenol as directed.   ?Some authorities believe that zinc sprays or the use of Echinacea may shorten the course of your symptoms. ? ? ?HOME CARE ?Only take medications as instructed by your medical team. ?Be sure to drink plenty of fluids. Water is fine as well as fruit juices, sodas  and electrolyte beverages. You may want to stay away from caffeine or alcohol. If you are nauseated, try taking small sips of liquids. How do you know if you are getting enough fluid? Your urine should be a pale yellow or almost colorless. ?Get rest. ?Taking a steamy shower or using a humidifier may help nasal congestion and ease sore throat pain. You can place a towel over your head and breathe in the steam from hot water coming from a faucet. ?Using a saline nasal spray works much the same way. ?Cough drops, hard candies and sore throat lozenges may ease your cough. ?Avoid close contacts especially the very young and the  elderly ?Cover your mouth if you cough or sneeze ?Always remember to wash your hands.  ? ?GET HELP RIGHT AWAY IF: ?You develop worsening fever. ?If your symptoms do not improve within 10 days ?You develop yellow or green discharge from your nose over 3 days. ?You have coughing fits ?You develop a severe head ache or visual changes. ?You develop shortness of breath, difficulty breathing or start having chest pain ?Your symptoms persist after you have completed your treatment plan ? ?MAKE SURE YOU  ?Understand these instructions. ?Will watch your condition. ?Will get help right away if you are not doing well or get worse. ? ?Thank you for choosing an e-visit. ? ?Your e-visit answers were reviewed by a board certified advanced clinical practitioner to complete your personal care plan. Depending upon the condition, your plan could have included both over the counter or prescription medications. ? ?Please review your pharmacy choice. Make sure the pharmacy is open so you can pick up prescription now. If there is a problem, you may contact your provider through [employer] and have the prescription routed to another pharmacy.  Your safety is important to [place]. If you have drug allergies check your prescription carefully.  ? ?For the next 24 hours you can use MyChart to ask questions about today's visit, request a non-urgent call back, or ask for a work or school excuse. ?You will get an email in the next two days asking about your experience. I hope that your e-visit has been valuable and will speed your recovery. ? ? ? ? ?

## 2023-12-08 ENCOUNTER — Encounter: Payer: Self-pay | Admitting: Family Medicine

## 2023-12-08 DIAGNOSIS — G4700 Insomnia, unspecified: Secondary | ICD-10-CM

## 2023-12-09 ENCOUNTER — Ambulatory Visit: Payer: 59

## 2023-12-09 VITALS — Ht 63.75 in | Wt 118.0 lb

## 2023-12-09 DIAGNOSIS — Z1211 Encounter for screening for malignant neoplasm of colon: Secondary | ICD-10-CM

## 2023-12-09 MED ORDER — NA SULFATE-K SULFATE-MG SULF 17.5-3.13-1.6 GM/177ML PO SOLN: 1.0000 | Freq: Once | ORAL | 0 refills | Status: AC

## 2023-12-09 NOTE — Progress Notes (Signed)

## 2023-12-12 MED ORDER — ZOLPIDEM TARTRATE 5 MG PO TABS: 5.0000 mg | ORAL_TABLET | Freq: Every evening | ORAL | 5 refills | Status: DC | PRN

## 2023-12-12 NOTE — Telephone Encounter (Signed)
 Done

## 2023-12-15 NOTE — Telephone Encounter (Signed)
 Spoke with pt aware that the office will notified when the PA is approved

## 2023-12-15 NOTE — Telephone Encounter (Signed)
 Please send a PA for Zolpidem 5 MG tablets

## 2023-12-16 ENCOUNTER — Other Ambulatory Visit (HOSPITAL_COMMUNITY): Payer: Self-pay

## 2023-12-16 ENCOUNTER — Telehealth: Payer: Self-pay

## 2023-12-16 NOTE — Telephone Encounter (Signed)
 Per test claim, insurance will pay for 0.5 tablets daily, ran test claim for 0.5 of zolpidem 10mg  tabs, quantity 15 tabs for 30 days, and received paid claim for $2.78. Please change if clinically appropriate, or advise as to why non preferred therapy is medically necessary. Thank you

## 2023-12-16 NOTE — Telephone Encounter (Signed)
 Pharmacy Patient Advocate Encounter   Received notification from Patient Advice Request messages that prior authorization for Zolpidem 5 mg tabs is required/requested.   Insurance verification completed.   The patient is insured through [employer] .   Per test claim:  0.5 of 10 mg tablets is preferred by the insurance.  If suggested medication is appropriate, Please send in a new RX and discontinue this one. If not, please advise as to why it's not appropriate so that we may request a Prior Authorization. Please note, some preferred medications may still require a PA.  If the suggested medications have not been trialed and there are no contraindications to their use, the PA will not be submitted, as it will not be approved.   If written for 0.5 tablet of 10mg  tablet daily, paid claim of $2.78

## 2023-12-19 ENCOUNTER — Telehealth: Payer: Self-pay | Admitting: Internal Medicine

## 2023-12-19 ENCOUNTER — Encounter: Payer: Self-pay | Admitting: Family Medicine

## 2023-12-19 DIAGNOSIS — L509 Urticaria, unspecified: Secondary | ICD-10-CM

## 2023-12-19 MED ORDER — ZOLPIDEM TARTRATE 5 MG PO TABS: 5.0000 mg | ORAL_TABLET | Freq: Every evening | ORAL | 5 refills | Status: DC | PRN

## 2023-12-19 MED ORDER — EPINEPHRINE 0.3 MG/0.3ML IJ SOAJ: 0.3000 mg | INTRAMUSCULAR | 5 refills | Status: AC | PRN

## 2023-12-19 NOTE — Telephone Encounter (Signed)
 Done

## 2023-12-19 NOTE — Telephone Encounter (Signed)
 The best plan for her is to get the generic Zolpidem but pay cash for it

## 2023-12-19 NOTE — Telephone Encounter (Signed)
 Good morning Dr. Leonides Schanz,   Patient is wishing to reschedule 4/3 colonoscopy due to an upper respiratory infection. Patient has rescheduled for 5/23.   Thank you.

## 2023-12-22 ENCOUNTER — Encounter: Payer: 59 | Admitting: Internal Medicine

## 2023-12-23 NOTE — Telephone Encounter (Signed)
 I understand. She has 6 months worth of refills at the pharmacy

## 2023-12-26 ENCOUNTER — Other Ambulatory Visit: Payer: Self-pay | Admitting: Nurse Practitioner

## 2023-12-26 ENCOUNTER — Other Ambulatory Visit: Payer: Self-pay | Admitting: Family Medicine

## 2023-12-26 DIAGNOSIS — N951 Menopausal and female climacteric states: Secondary | ICD-10-CM

## 2023-12-26 DIAGNOSIS — L509 Urticaria, unspecified: Secondary | ICD-10-CM

## 2023-12-26 DIAGNOSIS — G4700 Insomnia, unspecified: Secondary | ICD-10-CM

## 2023-12-27 NOTE — Telephone Encounter (Signed)
 Med refill request: Effexor Last AEX: 06/30/2023-TW Next AEX: nothing currently, recall placed for 2025 Last MMG (if hormonal med): n/a Refill authorized: per encounter dated 08/08/2023-deny refills until pt schedules f/u. Refill denied and routed to provider for final review.

## 2024-01-26 ENCOUNTER — Encounter: Payer: Self-pay | Admitting: Internal Medicine

## 2024-02-10 ENCOUNTER — Encounter: Admitting: Internal Medicine

## 2024-02-27 ENCOUNTER — Telehealth: Admitting: Physician Assistant

## 2024-02-27 DIAGNOSIS — M10071 Idiopathic gout, right ankle and foot: Secondary | ICD-10-CM

## 2024-02-27 MED ORDER — COLCHICINE 0.6 MG PO TABS: ORAL_TABLET | ORAL | 0 refills | Status: DC

## 2024-02-27 NOTE — Progress Notes (Signed)
 E-Visit for Gout Symptoms  We are sorry that you are not feeling well. We are here to help!  Based on what you shared with me it looks like you have a flare of your gout.  Gout is a form of arthritis. It can cause pain and swelling in the joints. At first, it tends to affect only 1 joint - most frequently the big toe. It happens in people who have too much uric acid in the blood. Uric acid is a chemical that is produced when the body breaks down certain foods. Uric acid can form sharp needle-like crystals that build up in the joints and cause pain. Uric acid crystals can also form inside the tubes that carry urine from the kidneys to the bladder. These crystals can turn into "kidney stones" that can cause pain and problems with the flow of urine. People with gout get sudden "flares" or attacks of severe pain, most often the big toe, ankle, or knee. Often the joint also turns red and swells. Usually, only 1 joint is affected, but some people have pain in more than 1 joint. Gout flares tend to happen more often during the night.  The pain from gout can be extreme. The pain and swelling are worst at the beginning of a gout flare. The symptoms then get better within a few days to weeks. It is not clear how the body "turns off" a gout flare.  Do not start any NEW preventative medicine until the gout has cleared completely. However, If you are already on Probenecid or Allopurinol for CHRONIC gout, you may continue taking this during an active flare up  I have prescribed Colchicine 0.6 mg tabs - Take 2 tabs immediately, then 1 tab twice per day for the duration of the flare up to a max of 7 days (but discontinue for stomach pains or diarrhea)    HOME CARE Losing weight can help relieve gout. It's not clear that following a specific diet plan will help with gout symptoms but eating a balanced diet can help improve your overall health. It can also help you lose weight, if you are overweight. In general, a  healthy diet includes plenty of fruits, vegetables, whole grains, and low-fat dairy products (labelled "low fat", skim, 2%). Avoid sugar sweetened drinks (including sodas, tea, juice and juice blends, coffee drinks and sports drinks) Limit alcohol to 1-2 drinks of beer, spirits or wine daily these can make gout flares worse. Some people with gout also have other health problems, such as heart disease, high blood pressure, kidney disease, or obesity. If you have any of these issues, it's important to work with your doctor to manage them. This can help improve your overall health and might also help with your gout.  GET HELP RIGHT AWAY IF: Your symptoms persist after you have completed your treatment plan You develop severe diarrhea You develop abnormal sensations  You develop vomiting,   You develop weakness  You develop abdominal pain  FOLLOW UP WITH YOUR PRIMARY PROVIDER IF: If your symptoms do not improve within 10 days  MAKE SURE YOU  Understand these instructions. Will watch your condition. Will get help right away if you are not doing well or get worse.  Thank you for choosing an e-visit.  Your e-visit answers were reviewed by a board certified advanced clinical practitioner to complete your personal care plan. Depending upon the condition, your plan could have included both over the counter or prescription medications.  Please review your  pharmacy choice. Make sure the pharmacy is open so you can pick up prescription now. If there is a problem, you may contact your provider through [employer] and have the prescription routed to another pharmacy.  Your safety is important to [place]. If you have drug allergies check your prescription carefully.   For the next 24 hours you can use MyChart to ask questions about today's visit, request a non-urgent call back, or ask for a work or school excuse. You will get an email in the next two days asking about your experience. I hope that your  e-visit has been valuable and will speed your recovery.    I have spent 5 minutes in review of e-visit questionnaire, review and updating patient chart, medical decision making and response to patient.   Margaretann Loveless, PA-C

## 2024-04-05 ENCOUNTER — Encounter: Admitting: Internal Medicine

## 2024-04-15 ENCOUNTER — Other Ambulatory Visit: Payer: Self-pay | Admitting: Nurse Practitioner

## 2024-04-15 DIAGNOSIS — N951 Menopausal and female climacteric states: Secondary | ICD-10-CM

## 2024-04-16 NOTE — Telephone Encounter (Signed)
 Med refill request: venlafaxine  XR 37.5 mg cap PO daily Last AEX: 06/30/23 -TW Next AEX: Not Scheduled Last MMG (if hormonal med) N/A  Spoke with patient, patient states she is no longer taking Effexor , RX not needed, medication discontinued.   AEX scheduled for 07/12/24 at 0800.   Routing to provider for final review. Patient is agreeable to disposition. Will close encounter.

## 2024-04-24 ENCOUNTER — Other Ambulatory Visit

## 2024-04-24 ENCOUNTER — Encounter

## 2024-04-27 ENCOUNTER — Telehealth: Admitting: Nurse Practitioner

## 2024-04-27 DIAGNOSIS — M10071 Idiopathic gout, right ankle and foot: Secondary | ICD-10-CM

## 2024-04-27 DIAGNOSIS — M1030 Gout due to renal impairment, unspecified site: Secondary | ICD-10-CM | POA: Insufficient documentation

## 2024-04-27 DIAGNOSIS — M10372 Gout due to renal impairment, left ankle and foot: Secondary | ICD-10-CM | POA: Insufficient documentation

## 2024-04-27 DIAGNOSIS — L720 Epidermal cyst: Secondary | ICD-10-CM | POA: Insufficient documentation

## 2024-04-27 DIAGNOSIS — L728 Other follicular cysts of the skin and subcutaneous tissue: Secondary | ICD-10-CM | POA: Insufficient documentation

## 2024-04-27 MED ORDER — COLCHICINE 0.6 MG PO TABS
ORAL_TABLET | ORAL | 0 refills | Status: DC
Start: 2024-04-27 — End: 2024-05-22

## 2024-04-27 MED ORDER — PREDNISONE 20 MG PO TABS: 20.0000 mg | ORAL_TABLET | Freq: Two times a day (BID) | ORAL | 0 refills | Status: DC

## 2024-04-27 NOTE — Addendum Note (Signed)
 Addended by: KENNYTH LAURAINE BRAVO on: 04/27/2024 09:17 AM   Modules accepted: Orders

## 2024-04-27 NOTE — Progress Notes (Signed)
 E-Visit for Gout Symptoms  We are sorry that you are not feeling well. We are here to help!  Based on what you shared with me it looks like you have a flare of your gout.  Gout is a form of arthritis. It can cause pain and swelling in the joints. At first, it tends to affect only 1 joint - most frequently the big toe. It happens in people who have too much uric acid in the blood. Uric acid is a chemical that is produced when the body breaks down certain foods. Uric acid can form sharp needle-like crystals that build up in the joints and cause pain. Uric acid crystals can also form inside the tubes that carry urine from the kidneys to the bladder. These crystals can turn into "kidney stones" that can cause pain and problems with the flow of urine. People with gout get sudden "flares" or attacks of severe pain, most often the big toe, ankle, or knee. Often the joint also turns red and swells. Usually, only 1 joint is affected, but some people have pain in more than 1 joint. Gout flares tend to happen more often during the night.  The pain from gout can be extreme. The pain and swelling are worst at the beginning of a gout flare. The symptoms then get better within a few days to weeks. It is not clear how the body "turns off" a gout flare.  Do not start any NEW preventative medicine until the gout has cleared completely. However, If you are already on Probenecid or Allopurinol for CHRONIC gout, you may continue taking this during an active flare up  I have prescribed Prednisone 40 mg daily for 7   HOME CARE Losing weight can help relieve gout. It's not clear that following a specific diet plan will help with gout symptoms but eating a balanced diet can help improve your overall health. It can also help you lose weight, if you are overweight. In general, a healthy diet includes plenty of fruits, vegetables, whole grains, and low-fat dairy products (labelled "low fat", skim, 2%). Avoid sugar sweetened  drinks (including sodas, tea, juice and juice blends, coffee drinks and sports drinks) Limit alcohol to 1-2 drinks of beer, spirits or wine daily these can make gout flares worse. Some people with gout also have other health problems, such as heart disease, high blood pressure, kidney disease, or obesity. If you have any of these issues, it's important to work with your doctor to manage them. This can help improve your overall health and might also help with your gout.  GET HELP RIGHT AWAY IF: Your symptoms persist after you have completed your treatment plan You develop severe diarrhea You develop abnormal sensations  You develop vomiting,   You develop weakness  You develop abdominal pain  FOLLOW UP WITH YOUR PRIMARY PROVIDER IF: If your symptoms do not improve within 10 days  MAKE SURE YOU  Understand these instructions. Will watch your condition. Will get help right away if you are not doing well or get worse.  Thank you for choosing an e-visit.  Your e-visit answers were reviewed by a board certified advanced clinical practitioner to complete your personal care plan. Depending upon the condition, your plan could have included both over the counter or prescription medications.  Please review your pharmacy choice. Make sure the pharmacy is open so you can pick up prescription now. If there is a problem, you may contact your provider through [employer] and have  the prescription routed to another pharmacy.  Your safety is important to [place]. If you have drug allergies check your prescription carefully.   For the next 24 hours you can use MyChart to ask questions about today's visit, request a non-urgent call back, or ask for a work or school excuse. You will get an email in the next two days asking about your experience. I hope that your e-visit has been valuable and will speed your recovery.   I spent approximately 5 minutes reviewing the patient's history, current symptoms and  coordinating their care today.

## 2024-05-11 ENCOUNTER — Ambulatory Visit
Admission: RE | Admit: 2024-05-11 | Discharge: 2024-05-11 | Disposition: A | Discharge status: Home / Self Care | Source: Ambulatory Visit | Attending: Family Medicine | Admitting: Family Medicine

## 2024-05-11 DIAGNOSIS — N6310 Unspecified lump in the right breast, unspecified quadrant: Secondary | ICD-10-CM

## 2024-05-14 ENCOUNTER — Encounter

## 2024-05-14 ENCOUNTER — Telehealth: Payer: Self-pay

## 2024-05-14 NOTE — Telephone Encounter (Signed)
 Mutiple attempts made to complete PV. Unable to reach patient. VM left. Pt to call the office back by 5 PM to have PV rescheduled. Pt made aware that in the event that we do not hear back from them their PV and scheduled procedure will be cancelled.

## 2024-05-17 ENCOUNTER — Encounter: Payer: Self-pay | Admitting: Family Medicine

## 2024-05-17 ENCOUNTER — Telehealth: Admitting: Physician Assistant

## 2024-05-17 ENCOUNTER — Telehealth: Payer: Self-pay | Admitting: *Deleted

## 2024-05-17 DIAGNOSIS — M109 Gout, unspecified: Secondary | ICD-10-CM

## 2024-05-17 DIAGNOSIS — M10071 Idiopathic gout, right ankle and foot: Secondary | ICD-10-CM

## 2024-05-17 NOTE — Telephone Encounter (Signed)
 Copied from CRM [id number]. Topic: Clinical - Medication Question >> May 17, 2024  9:47 AM Herma G wrote: Reason for CRM: Pt called to schedule an appt on Sep 3rd with PCP, Dr. Johnny due to experiencing gout and requested a phone call from a nurse at [phone] to see if it would be possible to get med prescribed to treat condition until appt date comes.

## 2024-05-17 NOTE — Progress Notes (Signed)
  Because of frequent flares of symptoms, and treatment for gout this month already via e-visit, I feel your condition warrants further evaluation and I recommend that you be seen in a face-to-face visit with your PCP or at local urgent care.   NOTE: There will be NO CHARGE for this E-Visit   If you are having a true medical emergency, please call 911.     For an urgent face to face visit, [facility] has multiple urgent care centers for your convenience.  Click the link below for the full list of locations and hours, walk-in wait times, appointment scheduling options and driving directions:  Urgent Care - [place], [place], [place], [place], [place], [place]  [place]     Your MyChart E-visit questionnaire answers were reviewed by a board certified advanced clinical practitioner to complete your personal care plan based on your specific symptoms.    Thank you for using e-Visits.

## 2024-05-22 ENCOUNTER — Telehealth: Payer: Self-pay | Admitting: Internal Medicine

## 2024-05-22 MED ORDER — COLCHICINE 0.6 MG PO TABS
0.6000 mg | ORAL_TABLET | Freq: Four times a day (QID) | ORAL | 5 refills | Status: AC | PRN
Start: 2024-05-22 — End: ?

## 2024-05-22 NOTE — Telephone Encounter (Signed)
 Left message for pt to call back

## 2024-05-22 NOTE — Telephone Encounter (Signed)
  FYI Pt has appointment for 05/23/24 for this problem

## 2024-05-22 NOTE — Telephone Encounter (Signed)
 Inbound call from pt requesting to reschedule her colonoscopy that is scheduled for September the 4 th. Patient stated that she is running a fever and would like to see if she can schedule with a different doctor if Dr. Federico does not have anything for the month of October. Please advise.

## 2024-05-22 NOTE — Telephone Encounter (Signed)
 I sent in the RX for Colchicine 

## 2024-05-23 ENCOUNTER — Encounter: Payer: Self-pay | Admitting: Family Medicine

## 2024-05-23 ENCOUNTER — Ambulatory Visit: Admitting: Family Medicine

## 2024-05-23 VITALS — BP 138/88 | HR 98 | Temp 98.3°F | Wt 132.0 lb

## 2024-05-23 DIAGNOSIS — M109 Gout, unspecified: Secondary | ICD-10-CM | POA: Insufficient documentation

## 2024-05-23 MED ORDER — ALLOPURINOL 100 MG PO TABS: 100.0000 mg | ORAL_TABLET | Freq: Every day | ORAL | 3 refills | Status: AC

## 2024-05-23 NOTE — Telephone Encounter (Signed)
 Left message for pt to call back

## 2024-05-23 NOTE — Progress Notes (Signed)
   Subjective:    Patient ID: Kathleen Rubio, female    DOB: August 11, 1976, 48 y.o.   MRN: [id number]  HPI Here to discuss gout. She is getting over a flare of gout in the right foot, and today she feels back to normal. She has been taking Colchine, and this usually works well for her. She notes that her flares are getting more frequent than they used to be, and she is averaging one a month.    Review of Systems  Constitutional: Negative.   Respiratory: Negative.    Cardiovascular: Negative.   Musculoskeletal:  Positive for arthralgias.       Objective:   Physical Exam Constitutional:      Appearance: Normal appearance.  Cardiovascular:     Rate and Rhythm: Normal rate and regular rhythm.     Pulses: Normal pulses.     Heart sounds: Normal heart sounds.  Pulmonary:     Effort: Pulmonary effort is normal.     Breath sounds: Normal breath sounds.  Musculoskeletal:     Comments: Right foot is normal   Neurological:     Mental Status: She is alert.           Assessment & Plan:  Gout. Her acute flare has resolved. We will check a uric acid level today, and we will start her on daily Allopurinol  100 mg for prophylaxis. Garnette Olmsted, MD

## 2024-05-24 ENCOUNTER — Encounter: Admitting: Internal Medicine

## 2024-05-25 NOTE — Telephone Encounter (Signed)
 Left message for patient to call back. Letter sent to pt.

## 2024-05-29 ENCOUNTER — Other Ambulatory Visit: Payer: Self-pay | Admitting: Family Medicine

## 2024-05-29 DIAGNOSIS — L509 Urticaria, unspecified: Secondary | ICD-10-CM

## 2024-06-17 ENCOUNTER — Other Ambulatory Visit: Payer: Self-pay | Admitting: Family Medicine

## 2024-06-17 ENCOUNTER — Other Ambulatory Visit: Payer: Self-pay | Admitting: Nurse Practitioner

## 2024-06-17 DIAGNOSIS — G4700 Insomnia, unspecified: Secondary | ICD-10-CM

## 2024-06-17 DIAGNOSIS — N951 Menopausal and female climacteric states: Secondary | ICD-10-CM

## 2024-06-17 DIAGNOSIS — E781 Pure hyperglyceridemia: Secondary | ICD-10-CM

## 2024-06-18 NOTE — Telephone Encounter (Signed)
 Med refill request:  venlafaxine  XR (EFFEXOR -XR) 37.5 MG 24 hr capsule   Last AEX:  06/30/23 Next AEX:  07/12/24  Refill authorized?  Please Advise.

## 2024-07-11 NOTE — Progress Notes (Deleted)
 Kathleen Rubio 11/12/1975 [id number]   History:  48 y.o. G0 presents for annual exam. Perimenopausal - cycles occur monthly but are slightly irregular. Has skipped months here and there. Effexor  for vasomotor symptoms and vaginal estrogen for dryness and painful intercourse. Normal pap history. H/O HTN, HLD. Smoker.   Gynecologic History No LMP recorded.   Contraception/Family planning: none Sexually active: Yes  Health Maintenance Last Pap: 06/30/2023. Results were: Normal + HR HPV Last mammogram: 05/11/2024. Results were: Probably benign small mass in the right breast at 4 o'clock, stable for 1 year Last colonoscopy: Never. Scheduled next week Last Dexa: Not indicated     06/22/2023   10:46 AM  Depression screen PHQ 2/9  Decreased Interest 1  Down, Depressed, Hopeless 1  PHQ - 2 Score 2  Altered sleeping 3  Tired, decreased energy 1  Change in appetite 1  Feeling bad or failure about yourself  0  Trouble concentrating 1  Moving slowly or fidgety/restless 1  Suicidal thoughts 0  PHQ-9 Score 9  Difficult doing work/chores Somewhat difficult     Past medical history, past surgical history, family history and social history were all reviewed and documented in the EPIC chart. Boyfriend of 10 years, long distance. Works remote in Product/process development scientist. Mother and MGM with breast cancer in their 63s.   ROS:  A ROS was performed and pertinent positives and negatives are included.  Exam:  There were no vitals filed for this visit.  There is no height or weight on file to calculate BMI.  General appearance:  Normal Thyroid :  Symmetrical, normal in size, without palpable masses or nodularity. Respiratory  Auscultation:  Clear without wheezing or rhonchi Cardiovascular  Auscultation:  Regular rate, without rubs, murmurs or gallops  Edema/varicosities:  Not grossly evident Abdominal  Soft,nontender, without masses, guarding or rebound.  Liver/spleen:  No organomegaly  noted  Hernia:  None appreciated  Skin  Inspection:  Grossly normal Breasts: Examined lying and sitting.   Right: Without masses, retractions, nipple discharge or axillary adenopathy.   Left: Without masses, retractions, nipple discharge or axillary adenopathy. Pelvic: External genitalia:  no lesions              Urethra:  normal appearing urethra with no masses, tenderness or lesions              Bartholins and Skenes: normal                 Vagina: normal appearing vagina with normal color and discharge, no lesions              Cervix: no lesions Bimanual Exam:  Uterus:  no masses or tenderness              Adnexa: no mass, fullness, tenderness              Rectovaginal: Deferred              Anus:  normal, no lesions   Assessment/Plan:  48 y.o. G0 to establish care.   Well female exam with routine gynecological exam - Education provided on SBEs, importance of preventative screenings, current guidelines, high calcium diet, regular exercise, and multivitamin daily. Labs with PCP.   Cervical cancer screening - Normal pap history.   Perimenopausal symptoms - Plan: venlafaxine  XR (EFFEXOR  XR) 37.5 MG 24 hr capsule daily. Educated on initial side effects and length of time for full effectiveness.   Vaginal dryness - Plan: estradiol  (ESTRACE  VAGINAL) 0.1 MG/GM  vaginal cream twice weekly. Use nightly x 1 week initially, then decrease to twice weekly.   Dyspareunia in female - Plan: estradiol  (ESTRACE  VAGINAL) 0.1 MG/GM vaginal cream twice weekly. Use nightly x 1 week initially, then decrease to twice weekly.  Screening for breast cancer - being followed for complex cyst. Stable in August.   Screening for colon cancer - Colonoscopy scheduled next week.   Screening for osteoporosis - Average risk. Will plan DXA at age 45.   No follow-ups on file.    Kathleen DELENA Shutter DNP, 2:20 PM 07/11/2024

## 2024-07-12 ENCOUNTER — Ambulatory Visit: Admitting: Nurse Practitioner

## 2024-07-12 DIAGNOSIS — Z01419 Encounter for gynecological examination (general) (routine) without abnormal findings: Secondary | ICD-10-CM

## 2024-07-12 DIAGNOSIS — Z124 Encounter for screening for malignant neoplasm of cervix: Secondary | ICD-10-CM

## 2024-07-12 DIAGNOSIS — N951 Menopausal and female climacteric states: Secondary | ICD-10-CM

## 2024-07-17 ENCOUNTER — Other Ambulatory Visit: Payer: Self-pay | Admitting: Family Medicine

## 2024-07-17 ENCOUNTER — Other Ambulatory Visit: Payer: Self-pay

## 2024-07-17 ENCOUNTER — Ambulatory Visit: Payer: Self-pay

## 2024-07-17 ENCOUNTER — Encounter: Payer: Self-pay | Admitting: Family Medicine

## 2024-07-17 DIAGNOSIS — L509 Urticaria, unspecified: Secondary | ICD-10-CM

## 2024-07-17 MED ORDER — PREDNISONE 10 MG PO TABS
10.0000 mg | ORAL_TABLET | Freq: Every day | ORAL | 0 refills | Status: AC
Start: 2024-07-17 — End: ?

## 2024-07-17 NOTE — Telephone Encounter (Signed)
 FYI Only or Action Required?: Action required by provider: medication refill request. Patient is requesting refill of Prednisone  sent to pharmacy in [place] (verified and pended) as she is out of town for work and reports she is out and does not want to use Epi Pen if it can be avoided.  Patient was last seen in primary care on 05/23/2024 by Johnny Garnette LABOR, MD.  Called Nurse Triage reporting Urticaria.  Symptoms began today.  Interventions attempted: Prescription medications: 6 Prednisone  10 mg .  Symptoms are: gradually improving.  Triage Disposition: Home Care  Patient/caregiver understands and will follow disposition?: Yes       Copied from CRM [id number]. Topic: Clinical - Red Word Triage >> Jul 17, 2024  7:44 AM Laymon HERO wrote: Red Word that prompted transfer to Nurse Triage:  hives covering most of her body- started today. Out of town for work now. Reason for Disposition  Widespread hives  Answer Assessment - Initial Assessment Questions 1. APPEARANCE: What does the rash look like?      Raised, red, grow and became plates 2. LOCATION: Where is the rash located?      Generalized, did avoid face 5. ONSET: When did the hives begin? (e.g., hours or days ago)      This AM 6. ITCHING: Does it itch? If Yes, ask: How bad is the itch?  (e.g., none, mild, moderate, severe)     Some areas are severe 7. RECURRENT PROBLEM: Have you had hives before? If Yes, ask: When was the last time? and What happened that time?      Has happened previously, pt has Epi pen if needed but would like a refill on Prednisone  8. TRIGGERS: Were you exposed to any new food, plant, cosmetic product or animal just before the hives began?     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 OTHER SYMPTOMS: Do you have any other symptoms? (e.g., fever, tongue swelling, difficulty breathing, abdomen pain)     None 10. PREGNANCY: Is there any chance you are pregnant? When was your last menstrual  period?       None  Protocols used: Hives-A-AH

## 2024-07-17 NOTE — Progress Notes (Signed)
 Done

## 2024-07-17 NOTE — Telephone Encounter (Signed)
 I already sent this in earlier today

## 2024-07-17 NOTE — Telephone Encounter (Signed)
 Spoke with Kathleen Rubio states to send the [facility] to [facility] listed below this message I woke up this morning to hives covering most of my body.  I do have some predniSONE  10 MG tablet with me and took 6 tablets in attempt for the hives to abate.     I am currently out of town in [place], [place] for work and do not have more of the predniSONE  10 MG tablet with me.  Can you call in a prescription to:   [facility]: [id number], [place], [place] [address] [phone]   I also have my Epipen  with me but, would prefer not to have to use it.

## 2024-07-17 NOTE — Telephone Encounter (Signed)
 I sent in the Prednisone  RX

## 2024-07-25 ENCOUNTER — Other Ambulatory Visit: Payer: Self-pay | Admitting: Medical Genetics

## 2024-07-25 DIAGNOSIS — Z006 Encounter for examination for normal comparison and control in clinical research program: Secondary | ICD-10-CM

## 2024-07-26 ENCOUNTER — Ambulatory Visit: Admitting: Family Medicine

## 2024-07-26 ENCOUNTER — Encounter: Payer: Self-pay | Admitting: Family Medicine

## 2024-07-26 VITALS — BP 120/78 | HR 75 | Temp 98.0°F | Wt 130.2 lb

## 2024-07-26 DIAGNOSIS — L509 Urticaria, unspecified: Secondary | ICD-10-CM | POA: Diagnosis not present

## 2024-07-26 DIAGNOSIS — T782XXD Anaphylactic shock, unspecified, subsequent encounter: Secondary | ICD-10-CM

## 2024-07-26 NOTE — Progress Notes (Signed)
   Subjective:    Patient ID: Kathleen Rubio, female    DOB: [birth year], 48 y.o.   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 She had developed hives over her body 2 days before that day, but on that day she also developed SOB and tongue swelling. EMS was called and she was given several doses of an EpiPen  and taken to the ED. While there she received another EpiPen  dose and IV steroids. Over the next 12 hours her symptoms resolved and she was able to go back to her hotel. She has been taking a Prednisone  taper since then, starting with 80 mg a day. She is now on 40 mg a day, and she took this earlier today. She has a bottle of 10 mg pills. Today she feels fine.    Review of Systems  Constitutional: Negative.   Respiratory: Negative.    Cardiovascular: Negative.   Skin:  Negative for rash.       Objective:   Physical Exam Constitutional:      General: She is not in acute distress.    Comments: Her face is a bit puffy   Cardiovascular:     Rate and Rhythm: Normal rate and regular rhythm.     Pulses: Normal pulses.     Heart sounds: Normal heart sounds.  Pulmonary:     Effort: Pulmonary effort is normal.     Breath sounds: Normal breath sounds.  Skin:    Findings: No rash.  Neurological:     Mental Status: She is alert.           Assessment & Plan:  She is recovering from a bout of anaphylaxis and hives. She will continue a slow taper of the Prednisone  over the next 16 days. She will add Pepcid 40 mg BID and Zyrtec BID. We will refer her to Allergy for a work up. I personally spent a total of 32 minutes in the care of the patient today including getting/reviewing separately obtained history, performing a medically appropriate exam/evaluation, placing orders, and independently interpreting results.  Garnette Olmsted, MD  Garnette Olmsted, MD

## 2024-08-13 ENCOUNTER — Other Ambulatory Visit: Payer: Self-pay | Admitting: Nurse Practitioner

## 2024-08-13 DIAGNOSIS — M10071 Idiopathic gout, right ankle and foot: Secondary | ICD-10-CM

## 2024-08-21 NOTE — Progress Notes (Unsigned)
 Kathleen Rubio 1976-01-06 [id number]   History:  48 y.o. G0 presents for annual exam. Postmenopausal. Wants to discuss HRT for management of hot flashes, night sweats and brain fog.  Normal pap history. H/O HTN, HLD. Smoker. Mother diagnosed with breast cancer at age 65 and had recurrence at age 13, MGM diagnosed in 64s. Genetic testing negative for patient.   Gynecologic History No LMP recorded. Patient is postmenopausal.   Contraception/Family planning: post menopausal status Sexually active: Yes  Health Maintenance Last Pap: 06/30/2023. Results were: Normal + HR HPV Last mammogram: 05/11/2024 (diagnostic). Results were: Stable, probable benign right breast mass Last colonoscopy: Never Last Dexa: Not indicated     08/22/2024    8:59 AM  Depression screen PHQ 2/9  Decreased Interest 0  Down, Depressed, Hopeless 0  PHQ - 2 Score 0     Past medical history, past surgical history, family history and social history were all reviewed and documented in the EPIC chart. Boyfriend of 10 years, long distance. Works remote in product/process development scientist.   ROS:  A ROS was performed and pertinent positives and negatives are included.  Exam:  Vitals:   08/22/24 0856  BP: 126/72  Pulse: (!) 103  SpO2: 99%  Weight: 135 lb (61.2 kg)  Height: 5' 4 (1.626 m)    Body mass index is 23.17 kg/m.  General appearance:  Normal Thyroid :  Symmetrical, normal in size, without palpable masses or nodularity. Respiratory  Auscultation:  Clear without wheezing or rhonchi Cardiovascular  Auscultation:  Regular rate, without rubs, murmurs or gallops  Edema/varicosities:  Not grossly evident Abdominal  Soft,nontender, without masses, guarding or rebound.  Liver/spleen:  No organomegaly noted  Hernia:  None appreciated  Skin  Inspection:  Grossly normal Breasts: Examined lying and sitting.   Right: Without masses, retractions, nipple discharge or axillary adenopathy.   Left: Without masses, retractions,  nipple discharge or axillary adenopathy. Pelvic: External genitalia:  no lesions              Urethra:  normal appearing urethra with no masses, tenderness or lesions              Bartholins and Skenes: normal                 Vagina: normal appearing vagina with normal color and discharge, no lesions              Cervix: no lesions Bimanual Exam:  Uterus:  no masses or tenderness              Adnexa: no mass, fullness, tenderness              Rectovaginal: Deferred              Anus:  normal, no lesions   Kathleen Rubio, CMA present as chaperone.   Assessment/Plan:  48 y.o. G0 for annual exam.   Well female exam with routine gynecological exam - Education provided on SBEs, importance of preventative screenings, current guidelines, high calcium diet, regular exercise, and multivitamin daily. Labs with PCP.   Depression screening - PHQ - 0  Cervical cancer screening - Plan: Cytology - PAP( [name]). 06/2023 normal + HR HPV. Pap today per guidelines.   Postmenopausal hormone therapy - Plan: progesterone (PROMETRIUM) 100 MG capsule nightly, estradiol  (CLIMARA ) 0.025 mg/24hr patch weekly. Educated on risks and benefits of use.   Screening for breast cancer - Stable benign right breast mass. UTD on screenings. Normal breast exam today.  Screening for colon cancer - Has not had screening. Plans to schedule colonoscopy.   Screening for osteoporosis - Average risk. Will plan DXA at age 81.   Return in about 6 weeks (around 10/03/2024) for Med follow up, 1 year for annual. .    Kathleen DELENA Shutter DNP, 9:32 AM 08/22/2024

## 2024-08-22 ENCOUNTER — Ambulatory Visit: Admitting: Nurse Practitioner

## 2024-08-22 ENCOUNTER — Other Ambulatory Visit (HOSPITAL_COMMUNITY)
Admission: RE | Admit: 2024-08-22 | Discharge: 2024-08-22 | Disposition: A | Discharge status: Home / Self Care | Source: Ambulatory Visit | Attending: Nurse Practitioner | Admitting: Nurse Practitioner

## 2024-08-22 ENCOUNTER — Encounter: Payer: Self-pay | Admitting: Nurse Practitioner

## 2024-08-22 VITALS — BP 126/72 | HR 103 | Ht 64.0 in | Wt 135.0 lb

## 2024-08-22 DIAGNOSIS — Z780 Asymptomatic menopausal state: Secondary | ICD-10-CM

## 2024-08-22 DIAGNOSIS — Z1331 Encounter for screening for depression: Secondary | ICD-10-CM | POA: Diagnosis not present

## 2024-08-22 DIAGNOSIS — Z79890 Hormone replacement therapy (postmenopausal): Secondary | ICD-10-CM

## 2024-08-22 DIAGNOSIS — Z01419 Encounter for gynecological examination (general) (routine) without abnormal findings: Secondary | ICD-10-CM | POA: Diagnosis not present

## 2024-08-22 DIAGNOSIS — N951 Menopausal and female climacteric states: Secondary | ICD-10-CM

## 2024-08-22 DIAGNOSIS — N898 Other specified noninflammatory disorders of vagina: Secondary | ICD-10-CM

## 2024-08-22 DIAGNOSIS — N941 Unspecified dyspareunia: Secondary | ICD-10-CM

## 2024-08-22 DIAGNOSIS — Z124 Encounter for screening for malignant neoplasm of cervix: Secondary | ICD-10-CM

## 2024-08-22 MED ORDER — ESTRADIOL 0.025 MG/24HR TD PTWK: 0.0250 mg | MEDICATED_PATCH | TRANSDERMAL | 1 refills | Status: AC

## 2024-08-22 MED ORDER — PROGESTERONE MICRONIZED 100 MG PO CAPS: 100.0000 mg | ORAL_CAPSULE | Freq: Every day | ORAL | 1 refills | Status: AC

## 2024-08-24 ENCOUNTER — Ambulatory Visit: Payer: Self-pay | Admitting: Nurse Practitioner

## 2024-08-24 LAB — CYTOLOGY - PAP
Comment: NEGATIVE
Diagnosis: NEGATIVE
High risk HPV: NEGATIVE

## 2024-08-29 ENCOUNTER — Encounter: Payer: Self-pay | Admitting: Family Medicine

## 2024-08-29 DIAGNOSIS — L509 Urticaria, unspecified: Secondary | ICD-10-CM

## 2024-08-30 ENCOUNTER — Other Ambulatory Visit: Payer: Self-pay | Admitting: Family Medicine

## 2024-08-30 DIAGNOSIS — E781 Pure hyperglyceridemia: Secondary | ICD-10-CM

## 2024-08-30 NOTE — Telephone Encounter (Signed)
 I refilled the Prednisone . She already has refill available for the Zolpidem 

## 2024-10-03 ENCOUNTER — Ambulatory Visit: Admitting: Nurse Practitioner

## 2024-10-21 ENCOUNTER — Other Ambulatory Visit: Payer: Self-pay | Admitting: Family Medicine

## 2024-10-24 ENCOUNTER — Encounter: Payer: Self-pay | Admitting: Nurse Practitioner
